# Patient Record
Sex: Female | Born: 1935
Health system: Southern US, Community
[De-identification: ages and names within clinical notes are randomized; demographics above are authoritative.]

## PROBLEM LIST (undated history)

## (undated) DIAGNOSIS — N189 Chronic kidney disease, unspecified: Secondary | ICD-10-CM

## (undated) DIAGNOSIS — E785 Hyperlipidemia, unspecified: Secondary | ICD-10-CM

## (undated) DIAGNOSIS — C801 Malignant (primary) neoplasm, unspecified: Secondary | ICD-10-CM

## (undated) DIAGNOSIS — I10 Essential (primary) hypertension: Secondary | ICD-10-CM

## (undated) DIAGNOSIS — M1990 Unspecified osteoarthritis, unspecified site: Secondary | ICD-10-CM

## (undated) HISTORY — DX: Hyperlipidemia, unspecified: E78.5

## (undated) HISTORY — DX: Malignant (primary) neoplasm, unspecified: C80.1

## (undated) HISTORY — DX: Essential (primary) hypertension: I10

## (undated) HISTORY — DX: Chronic kidney disease, unspecified: N18.9

## (undated) HISTORY — DX: Unspecified osteoarthritis, unspecified site: M19.90

## (undated) HISTORY — PX: HAND SURGERY: SHX662

---

## 2000-02-28 ENCOUNTER — Encounter: Admission: RE | Admit: 2000-02-28 | Discharge: 2000-02-28 | Payer: Self-pay | Admitting: Family Medicine

## 2000-02-28 ENCOUNTER — Encounter: Payer: Self-pay | Admitting: Family Medicine

## 2000-05-29 HISTORY — PX: COLON SURGERY: SHX602

## 2000-12-26 ENCOUNTER — Encounter (INDEPENDENT_AMBULATORY_CARE_PROVIDER_SITE_OTHER): Payer: Self-pay

## 2000-12-26 ENCOUNTER — Ambulatory Visit (HOSPITAL_COMMUNITY): Admission: RE | Admit: 2000-12-26 | Discharge: 2000-12-26 | Payer: Self-pay | Admitting: Gastroenterology

## 2001-01-07 ENCOUNTER — Encounter (INDEPENDENT_AMBULATORY_CARE_PROVIDER_SITE_OTHER): Payer: Self-pay | Admitting: *Deleted

## 2001-01-07 ENCOUNTER — Ambulatory Visit (HOSPITAL_COMMUNITY): Admission: RE | Admit: 2001-01-07 | Discharge: 2001-01-07 | Payer: Self-pay | Admitting: Gastroenterology

## 2001-01-07 ENCOUNTER — Encounter: Payer: Self-pay | Admitting: Gastroenterology

## 2001-01-31 ENCOUNTER — Encounter: Admission: RE | Admit: 2001-01-31 | Discharge: 2001-02-13 | Payer: Self-pay | Admitting: General Surgery

## 2001-01-31 ENCOUNTER — Encounter: Payer: Self-pay | Admitting: General Surgery

## 2001-02-07 ENCOUNTER — Inpatient Hospital Stay (HOSPITAL_COMMUNITY): Admission: RE | Admit: 2001-02-07 | Discharge: 2001-02-13 | Payer: Self-pay | Admitting: General Surgery

## 2001-02-07 ENCOUNTER — Encounter (INDEPENDENT_AMBULATORY_CARE_PROVIDER_SITE_OTHER): Payer: Self-pay | Admitting: Specialist

## 2001-10-25 ENCOUNTER — Encounter: Payer: Self-pay | Admitting: Family Medicine

## 2001-10-25 ENCOUNTER — Encounter: Admission: RE | Admit: 2001-10-25 | Discharge: 2001-10-25 | Payer: Self-pay | Admitting: Family Medicine

## 2002-10-03 ENCOUNTER — Encounter: Payer: Self-pay | Admitting: Surgery

## 2002-10-10 ENCOUNTER — Inpatient Hospital Stay (HOSPITAL_COMMUNITY): Admission: RE | Admit: 2002-10-10 | Discharge: 2002-10-13 | Payer: Self-pay | Admitting: Surgery

## 2002-10-26 ENCOUNTER — Emergency Department (HOSPITAL_COMMUNITY): Admission: EM | Admit: 2002-10-26 | Discharge: 2002-10-26 | Payer: Self-pay | Admitting: Emergency Medicine

## 2002-10-26 ENCOUNTER — Encounter: Payer: Self-pay | Admitting: Emergency Medicine

## 2003-01-29 ENCOUNTER — Encounter: Admission: RE | Admit: 2003-01-29 | Discharge: 2003-01-29 | Payer: Self-pay | Admitting: Family Medicine

## 2003-01-29 ENCOUNTER — Encounter: Payer: Self-pay | Admitting: Family Medicine

## 2004-03-14 ENCOUNTER — Encounter: Admission: RE | Admit: 2004-03-14 | Discharge: 2004-03-14 | Payer: Self-pay | Admitting: Family Medicine

## 2004-03-18 ENCOUNTER — Encounter: Admission: RE | Admit: 2004-03-18 | Discharge: 2004-03-18 | Payer: Self-pay | Admitting: Surgery

## 2005-07-06 ENCOUNTER — Encounter: Admission: RE | Admit: 2005-07-06 | Discharge: 2005-07-06 | Payer: Self-pay | Admitting: Family Medicine

## 2006-07-26 ENCOUNTER — Encounter: Admission: RE | Admit: 2006-07-26 | Discharge: 2006-07-26 | Payer: Self-pay | Admitting: Family Medicine

## 2006-10-01 ENCOUNTER — Encounter: Admission: RE | Admit: 2006-10-01 | Discharge: 2006-10-01 | Payer: Self-pay | Admitting: Family Medicine

## 2007-08-14 ENCOUNTER — Other Ambulatory Visit: Admission: RE | Admit: 2007-08-14 | Discharge: 2007-08-14 | Payer: Self-pay | Admitting: Obstetrics and Gynecology

## 2007-08-15 ENCOUNTER — Encounter: Admission: RE | Admit: 2007-08-15 | Discharge: 2007-08-15 | Payer: Self-pay | Admitting: Family Medicine

## 2008-12-03 ENCOUNTER — Encounter: Admission: RE | Admit: 2008-12-03 | Discharge: 2008-12-03 | Payer: Self-pay | Admitting: Family Medicine

## 2010-01-14 ENCOUNTER — Encounter: Admission: RE | Admit: 2010-01-14 | Discharge: 2010-01-14 | Payer: Self-pay | Admitting: Internal Medicine

## 2010-04-05 ENCOUNTER — Other Ambulatory Visit: Admission: RE | Admit: 2010-04-05 | Discharge: 2010-04-05 | Payer: Self-pay | Admitting: Obstetrics and Gynecology

## 2010-06-18 ENCOUNTER — Encounter: Payer: Self-pay | Admitting: Family Medicine

## 2010-10-14 NOTE — Procedures (Signed)
[place]  Patient:    Cheryl Wilcox, Cheryl Wilcox                       MRN: [id number] Proc. Date: 12/26/00 Adm. Date:  [id number] Attending:  Rich Brave CC:         Desma Maxim, M.D.   Procedure Report  PROCEDURE PERFORMED:  Colonoscopy with polypectomy.  ENDOSCOPIST:  Florencia Reasons, M.D.  INDICATIONS FOR PROCEDURE:  The patient is a 77 year old female for colon cancer screening.  FINDINGS:  Sessile polyp removed from the transverse colon.  DESCRIPTION OF PROCEDURE:  The nature, purpose and risks of the procedure had been discussed with the patient, who provided written consent.  Sedation was fentanyl 75 mcg and Versed 7.5 mg IV without arrhythmias or desaturation.  The Olympus adjustable tension pediatric video colonoscope was advanced quite easily to the cecum as identified by clear visualization of the appendiceal orifice after which pull-back was performed.  There was a 4 x 6 mm sessile polyp across a fold in a clam shell configuration in the midtransverse colon.  I injected the region around the polyp with a total of about 1.5 cc of 1:10,000 epinephrine leading to partial elevation of this lesion as well as regional blanching.  It was then snared off in two pieces with good hemostasis and no evidence of excessive cautery.  The fragments were retrieved by suctioning through the scope.  No other polyps were observed during this exam and I did not see any cancer, colitis, vascular malformations or diverticular disease.  Retroflexion in the rectum was normal.  The patient tolerated the procedure well and there were no apparent complications.  IMPRESSION:  Sessile polyp in the midcolon removed as described above.  PLAN:  Await pathology on the polyp with anticipated colonoscopic follow-up in three years if it is adenomatous in character.DD:  12/26/00 TD:  12/26/00 Job: [id number]

## 2010-10-14 NOTE — Op Note (Signed)
[facility]  Patient:    LAKINA, MCINTIRE [facility] Visit Number: [id number] MRN: [id number]          Service Type: SUR Location: 4W 0442 01 Attending Physician:  Arlis Porta Dictated by:   Adolph Pollack, M.D. Proc. Date: 02/07/01 Admit Date:  02/07/2001   CC:         Florencia Reasons, M.D.  Desma Maxim, M.D.   Operative Report  PREOPERATIVE DIAGNOSIS:  Transverse colon cancer.  POSTOPERATIVE DIAGNOSIS:  Transverse colon cancer plus splenic injury.  OPERATION: 1. Transverse colectomy. 2. Splenectomy.  SURGEON:  Adolph Pollack, M.D.  ASSISTANT:  Sheppard Plumber. Earlene Plater, M.D.  ANESTHESIA:  General  INDICATION:  Mrs. Ives is a 77 year old female undergoing screening colonoscopy.  She was found to have a polyp in the transverse colon and biopsy that showed invasive adenocarcinoma.  She underwent a subsequent colonoscopy and the area was biopsied.  No residual tumor was found.  However, because it was an invasive component close to the stalk, I was concerned that residual cancer was left behind.  Dr. Matthias Hughs injected dye into the area for tattooing and also took a picture of the colonoscope at the site.  She now presents for elective colon resection.  DESCRIPTION OF PROCEDURE:  She was placed supine on the operating table and a general anesthetic was administered.  The abdomen was sterilely prepped and draped.  An upper midline incision was made incising the skin sharply and dividing the subcutaneous tissue, fascia and perineum with electrocautery. Once the peritoneal cavity was entered, the abdomen was explored.  The liver appeared normal without nodules.  No gallstones were noted. No small intestinal lesions were noted.  No pelvic lesions were noted.  I visualized the transverse colon and noted the tattooing in the mid transverse colon.  I subsequently began mobilizing the splenic flexure which is close to the spleen. It came  down fairly easily.  I then mobilized part of the omentum free from the distal transverse colon.  I also mobilized the left colon by incising the White line of [place] and rotating it medially. It was doing this time, that I noticed some blood in the left upper quadrant area and upon examining this, there was a tear in the spleen well away from where the mobilization occurred. I went ahead and packed this and proceeded along with the operation.  I did mobilize the hepatic flexure, then divided the colon at the proximal descending colon and at the hepatic flexure.  I then divided the mesenteric vessels between clamps and ligated them.  I partially divided the omentum doing a partial omentectomy.  I then removed the specimen and sent it to pathology.  Upon opening up the specimen, the pathologist reported to me that they had the old polypectomy site.  Next, I reinspected the left upper quadrant area and noticed there was still some bleeding from a 1 to 1.5 cm tear in the splenic capsule posteriorly. It was not near the hilum.  There was no active vessel bleeding.  I went ahead and placed some Surgicel here and held direct pressure.  This slowed down the bleeding, so I reapplied Surgicel and packed off the spleen. I then directed my attention to the colon.  I reconstituted intestinal continuity with a two layer end-to-end anastomosis, the anterior and posterior layers, the outside layers with 3-0 silk and the internal layer with a running 3-0 Vicryl.  The anastomosis was patent, viable and under no  tension.  I then closed the mesenteric defect with interrupted sutures.  Once again, I reinspected the left upper quadrant area and noticed there was still some bleeding.  I tried electrocautery at very high settings and continued packing.  I spent 45 minutes trying to stop the bleeding; however, the blood loss began to mount.  I subsequently decided to perform a splenectomy.  I rotated the spleen  medially and incised its splenophrenic, splenocolic, and splenorenal attachments.  I divided short gastric vessels after ligating them.  I ligated the splenic artery and vein and divided it.  I removed the spleen from the field and sent it to pathology.  There was one short gastric vessel still oozing and I went ahead and obtained hemostasis with interrupted sutures.  Once the left upper quadrant area was hemostatic, I went ahead and irrigated the abdominal cavity.  No further bleeding was noted.  Subsequently, I closed the fascia with a running #1 PDS suture.  Subcutaneous tissue was irrigated and skin closed with staples.  She lost approximately 1500 cc of blood. She did not receive a blood transfusion and remained hemodynamically stable through the entire operation. She did have the complication of a splenic capsular tear requiring splenectomy; otherwise she did well throughout the operation, was extubated and was taken to the recovery room in satisfactory condition. Dictated by:   Adolph Pollack, M.D. Attending Physician:  Arlis Porta DD:  02/07/01 TD:  02/08/01 Job: [id number]

## 2010-10-14 NOTE — Op Note (Signed)
[place]  Patient:    Cheryl Wilcox, Cheryl Wilcox                       MRN: [id number] Proc. Date: 01/07/01 Adm. Date:  [id number] Attending:  Rich Brave CC:         Desma Maxim, M.D.   Operative Report  PROCEDURE:  Colonoscopy with biopsies.  INDICATIONS:  This is a 77 year old female 12 days status post screening colonoscopy at which time a benign-appearing 6 mm polyp was snared off from the midcolon.  But to our surprise, the pathology came back showing focci of invasive adenocarcinoma within the lesion.  The purpose of this procedure is to mark the site in the event that surgical management is elected at a later time.  FINDINGS:  Polypectomy site identified near the splenic flexure, biopsied, x-rayd, and tattooed.  Two dimminutive sessile polyps biopsied.  DESCRIPTION OF PROCEDURE:  The nature, purpose, and risks of the procedure were familiar to the patient who provided written consent.  Sedation was fentanyl 50 mcg and Versed 5 mg IV without arrhythmias or desaturation.  The Olympus pediatric adjustable tension video colonoscope was advanced without difficulty to the cecum and pullback was then performed.  There was a 3 mm semipedunculated polyp just above the cecum removed by a couple of cold biopsies and there was a dimminuted 2 to 3 mm sessile flat hyperplastic appearing polyp at 10 cm removed by a single cold biopsy.  The main finding on this examination, however, was successful localization of the polypectomy site.  A KUB was obtained to document the location of this, and viewing the film it looked like it was just proximal to the splenic flexure.  Multiple biopsies were made from the site to help confirm the presence or absence of any residual adenomatous or carcinomatous tissue. Finally, the site was injected with [place] Ink to help localize it for further surveillance colonoscopies or in the event that surgery is  elected.  Retroflexion was not performed in the rectum.  No diverticular disease, large polyps, cancer, colitis, or vascular malformations were seen.  The patient tolerated the procedure well and there were no apparent complications.  IMPRESSION: 1. Polypectomy site biopsied, injected, and x-rayd as described above. 2. Small sessile polyps removed.  PLAN:  Await pathology.  Office follow-up in three days. DD:  01/07/01 TD:  01/07/01 Job: [id number]

## 2010-10-14 NOTE — Op Note (Signed)
Cheryl Wilcox, MACLAREN                          ACCOUNT NO.:  [id number]   MEDICAL RECORD NO.:  [id number]                   PATIENT TYPE:  INP   LOCATION:  0002                                 FACILITY:  [facility]   PHYSICIAN:  Velora Heckler, M.D.                DATE OF BIRTH:  1927/10/10   DATE OF PROCEDURE:  10/10/2002  DATE OF DISCHARGE:                                 OPERATIVE REPORT   PREOPERATIVE DIAGNOSIS:  Ventral incisional hernia, incarcerated.   POSTOPERATIVE DIAGNOSIS:  Ventral incisional hernia, incarcerated.   PROCEDURE:  Laparoscopic repair of ventral incisional hernia with polyester  mesh.   SURGEON:  Velora Heckler, M.D.   ASSISTANT:  Sheppard Plumber. Earlene Plater, M.D.   ANESTHESIA:  General.   ESTIMATED BLOOD LOSS:  Minimal.   PREPARATION:  Betadine.   COMPLICATIONS:  None.   INDICATIONS:  The patient is a 77 year old, white female, known to our  practice following colon resection by Adolph Pollack, M.D. in September  2002.  The patient subsequently developed a ventral incisional hernia.  She  returns to our practice for repair.   DESCRIPTION OF PROCEDURE:  The procedure is done in OR #1 at the [facility].  The patient is brought to the operating room, placed in  a supine position on the operating room table.  Following administration of  general anesthesia, the patient is prepped and draped in the usual strict  aseptic fashion.  After ascertaining that an adequate level of anesthesia  had been obtained, a right upper quadrant incision is made with a #15 blade.  Dissection is carried down to the fascia.  External oblique fascia is  incised, and a 0 Vicryl pursestring suture is placed.  Muscle-splitting  incision is then carried down to the peritoneum which is incised sharply,  and the peritoneal cavity is entered.  An Hasson cannula is introduced and  secured with the pursestring suture.  The abdomen is insufflated with carbon  dioxide.   Laparoscope is then introduced.  There are adhesions to the  midline.  There are multiple anterior fascial defects, extending from just  below the xiphoid process to the umbilicus.  There are at least four  separate defects.  In the largest uppermost defect, the transverse colon is  incarcerated.  A moderate amount of omentum is also incarcerated within the  hernia defects.  Operative ports are placed in the right lower quadrant and  two ports on the left side of the abdomen.  Using blunt dissection, sharp  dissection, and electrocautery for hemostasis, the adhesions are taken down  off the anterior abdominal wall.  Colon is gently reduced from the hernia  defect, and adhesions are lysed using the Endoshears.  With careful  dissection, all of the adhesions to the anterior abdominal wall are freed up  to the level of the falciform ligament.  The colon is  reduced without  injury.  Next, a spinal needle is used to mark the margins of all hernia  defects on the abdominal wall.  A 4 cm overlap is then measured.  A 20 cm x  30 cm sheet of polyester mesh is then selected.  It is prepared by placing  interrupted 0 Novofil sutures at eight separate locations around the edges  of the polyester mesh.  The mesh is then prepared by hydrating the mesh and  rolling it and inserting through the right upper quadrant port into the  peritoneal cavity under direct vision.  Mesh is unrolled and positioned  properly with the mesh side facing the abdominal wall and the hydrocellulose-  treated smooth side facing the viscera.  Incisions are then made  circumferentially on the abdominal wall and using the Storz suture  retriever, the sutures are retrieved and brought through the abdominal wall.  After retrieving all eight sutures through the abdominal wall, the sutures  are pulled tightly up against the abdominal wall, positioning the mesh  behind the anterior abdominal wall in close approximation.  All sutures  are  then tied and divided.  Skin is elevated so as not to retract the skin with  the subcutaneous tissues.  Good approximation of the mesh to the anterior  abdominal wall is noted.  Good coverage of all hernia defects is noted.  Using an Ethicon endoscopic tacking device, the mesh is then secured further  to the anterior abdominal wall with a circumferential single row of tacks  spaced approximately 1.5-2 cm apart.  This provides good close approximation  of the mesh to the anterior abdominal wall.  Good hemostasis is noted.  Ports are removed under direct vision with good hemostasis noted at port  sites.  Pneumoperitoneum is released.  Surgical wounds are closed with  interrupted 4-0 Vicryl subcuticular sutures.  Wounds are washed and dried.  Local anesthetic is injected.  Benzoin and Steri-Strips are applied.  Sterile gauze dressings are applied.  An 11 inch abdominal binder is  applied.  The patient is awakened from anesthesia and brought to the  recovery room in stable condition.  The patient tolerated the procedure  well.                                               Velora Heckler, M.D.    TMG/MEDQ  D:  10/10/2002  T:  10/10/2002  Job:  [id number]   cc:   Donia Guiles, M.D.  [address] [place]  [place] [address]  Fax: [phone]   Bernette Redbird, M.D.  [address] [place]  [place], [place] [address]  Fax: [phone]

## 2010-10-14 NOTE — Discharge Summary (Signed)
   Cheryl Wilcox, Cheryl Wilcox                          ACCOUNT NO.:  [id number]   MEDICAL RECORD NO.:  [id number]                   PATIENT TYPE:  INP   LOCATION:  0482                                 [facility]ACILITY[facility]  [facility]   PHYSICIAN:  Velora Heckler, M.D.                DATE OF BIRTH:  1927/11/02   DATE OF ADMISSION:  10/10/2002  DATE OF DISCHARGE:  10/13/2002                                 DISCHARGE SUMMARY   REASON FOR ADMISSION:  Ventral incisional hernia.   BRIEF HISTORY:  The patient is a 77 year old, white female, well-known to  our surgical practice.  The patient had undergone colon resection in  September 2002.  She subsequently developed ventral incisional hernia.  She  now returns for repair.   HOSPITAL COURSE:  The patient was admitted on Oct 10, 2002.  She was taken  to the operating room where she underwent laparoscopic repair of ventral  incisional hernia with polyester mesh.  Postoperative course was relatively  straightforward.  The patient was started on clear liquid diet and advanced  to a regular diet.  She became ambulatory.  She was prepared for discharge  home on the third postoperative day.   DISCHARGE PLANNING:  The patient is discharged home Oct 13, 2002, in good  condition, tolerating a regular diet, and ambulating independently.  She  will be seen back in my office at [facility] in 10 days for a  wound check.   DISCHARGE MEDICATIONS:  Vicodin p.r.n. pain.   FINAL DIAGNOSIS:  Ventral incisional hernia, incarcerated.  Condition.   CONDITION AT DISCHARGE:  Good.                                               Velora Heckler, M.D.    TMG/MEDQ  D:  10/13/2002  T:  10/13/2002  Job:  [id number]   cc:   Donia Guiles, M.D.  [address] [place]  [place] [address]  Fax: [phone]   Bernette Redbird, M.D.  [address] [place]  [place], [place] [address]  Fax: [phone]

## 2010-10-14 NOTE — Discharge Summary (Signed)
[facility]  Patient:    Cheryl Wilcox, Cheryl Wilcox [facility] Visit Number: [id number] MRN: [id number]          Service Type: SUR Location: 4W 0442 01 Attending Physician:  Arlis Porta Admit Date:  02/07/2001 Discharge Date: 02/13/2001   CC:         Florencia Reasons, M.D.  Desma Maxim, M.D.   Discharge Summary  PRINCIPAL DISCHARGE DIAGNOSIS:  Transverse colon cancer.  SECONDARY DIAGNOSES: 1. Intraoperative splenic laceration. 2. Hyperlipidemia. 3. Hypertension.  PROCEDURE:  Transverse colectomy and splenectomy 02/07/01.  REASON FOR ADMISSION:  Ms. Bomkamp is a 77 year old female who underwent screening colonoscopy and was found to have a polyp in the distal transverse colon which was biopsied and positive for invasive carcinoma.  She now presents for partial colectomy.  HOSPITAL COURSE:  She underwent the above procedure and during the operation, she sustained a small splenic laceration.  This could not be controlled with normal measures, and thus she required a splenectomy.  Postoperatively she received ______ for pneumococcus, meningococcus, and Haemophilus influenza B. She was enrolled in the Adalor study for postoperative ileus and received m ed by way of the research nurse during her entire stay.  She essentially had some nausea and hypokalemia but otherwise an unremarkable postoperative course.  On her fourth postoperative day, she had return of her bowel function with bowel movements and flatus.  Her nausea improved.  She was put on oral analgesics. Pathology came back as a T2,N0,M1 cancer, thus a stage I colon cancer. Potassium was corrected, she was able to be discharged in satisfactory condition on postoperative day #6.  DISPOSITION:  Discharged to home 02/13/01.  She was told she could resume her home medications and was given Mepergan for pain.  She was given activity restrictions and a discharge instruction sheet.  She will come  back and see me in the office in two weeks. Attending Physician:  Arlis Porta DD:  02/25/01 TD:  02/25/01 Job: [id number]

## 2011-02-01 ENCOUNTER — Other Ambulatory Visit: Payer: Self-pay | Admitting: Internal Medicine

## 2011-02-01 DIAGNOSIS — Z1231 Encounter for screening mammogram for malignant neoplasm of breast: Secondary | ICD-10-CM

## 2011-03-09 ENCOUNTER — Ambulatory Visit: Payer: Self-pay

## 2011-03-23 ENCOUNTER — Ambulatory Visit
Admission: RE | Admit: 2011-03-23 | Discharge: 2011-03-23 | Disposition: A | Discharge status: Home / Self Care | Payer: Medicare Other | Source: Ambulatory Visit | Attending: Internal Medicine | Admitting: Internal Medicine

## 2011-03-23 DIAGNOSIS — Z1231 Encounter for screening mammogram for malignant neoplasm of breast: Secondary | ICD-10-CM

## 2011-03-29 ENCOUNTER — Other Ambulatory Visit: Payer: Self-pay | Admitting: Internal Medicine

## 2011-03-29 ENCOUNTER — Other Ambulatory Visit: Payer: Self-pay | Admitting: Gastroenterology

## 2011-03-29 DIAGNOSIS — R928 Other abnormal and inconclusive findings on diagnostic imaging of breast: Secondary | ICD-10-CM

## 2011-04-12 ENCOUNTER — Ambulatory Visit
Admission: RE | Admit: 2011-04-12 | Discharge: 2011-04-12 | Disposition: A | Discharge status: Home / Self Care | Payer: Medicare Other | Source: Ambulatory Visit | Attending: Internal Medicine | Admitting: Internal Medicine

## 2011-04-12 DIAGNOSIS — R928 Other abnormal and inconclusive findings on diagnostic imaging of breast: Secondary | ICD-10-CM

## 2011-09-06 DIAGNOSIS — R634 Abnormal weight loss: Secondary | ICD-10-CM | POA: Diagnosis not present

## 2011-09-06 DIAGNOSIS — I10 Essential (primary) hypertension: Secondary | ICD-10-CM | POA: Diagnosis not present

## 2011-09-06 DIAGNOSIS — E782 Mixed hyperlipidemia: Secondary | ICD-10-CM | POA: Diagnosis not present

## 2011-09-19 ENCOUNTER — Other Ambulatory Visit: Payer: Self-pay | Admitting: Family Medicine

## 2011-09-19 DIAGNOSIS — R921 Mammographic calcification found on diagnostic imaging of breast: Secondary | ICD-10-CM

## 2011-10-12 ENCOUNTER — Ambulatory Visit
Admission: RE | Admit: 2011-10-12 | Discharge: 2011-10-12 | Disposition: A | Discharge status: Home / Self Care | Payer: Medicare Other | Source: Ambulatory Visit | Attending: Family Medicine | Admitting: Family Medicine

## 2011-10-12 DIAGNOSIS — R928 Other abnormal and inconclusive findings on diagnostic imaging of breast: Secondary | ICD-10-CM | POA: Diagnosis not present

## 2011-10-12 DIAGNOSIS — R921 Mammographic calcification found on diagnostic imaging of breast: Secondary | ICD-10-CM

## 2011-11-20 DIAGNOSIS — R42 Dizziness and giddiness: Secondary | ICD-10-CM | POA: Diagnosis not present

## 2011-11-20 DIAGNOSIS — D492 Neoplasm of unspecified behavior of bone, soft tissue, and skin: Secondary | ICD-10-CM | POA: Diagnosis not present

## 2011-11-22 ENCOUNTER — Other Ambulatory Visit: Payer: Self-pay | Admitting: Family Medicine

## 2011-11-22 DIAGNOSIS — C44621 Squamous cell carcinoma of skin of unspecified upper limb, including shoulder: Secondary | ICD-10-CM | POA: Diagnosis not present

## 2011-12-11 DIAGNOSIS — C4492 Squamous cell carcinoma of skin, unspecified: Secondary | ICD-10-CM | POA: Diagnosis not present

## 2012-02-28 DIAGNOSIS — Z23 Encounter for immunization: Secondary | ICD-10-CM | POA: Diagnosis not present

## 2012-03-04 DIAGNOSIS — Z23 Encounter for immunization: Secondary | ICD-10-CM | POA: Diagnosis not present

## 2012-03-04 DIAGNOSIS — M899 Disorder of bone, unspecified: Secondary | ICD-10-CM | POA: Diagnosis not present

## 2012-03-04 DIAGNOSIS — Z1331 Encounter for screening for depression: Secondary | ICD-10-CM | POA: Diagnosis not present

## 2012-03-04 DIAGNOSIS — I10 Essential (primary) hypertension: Secondary | ICD-10-CM | POA: Diagnosis not present

## 2012-03-04 DIAGNOSIS — Z0000 Encounter for general adult medical examination without abnormal findings: Secondary | ICD-10-CM | POA: Diagnosis not present

## 2012-03-04 DIAGNOSIS — K7689 Other specified diseases of liver: Secondary | ICD-10-CM | POA: Diagnosis not present

## 2012-03-04 DIAGNOSIS — Z85038 Personal history of other malignant neoplasm of large intestine: Secondary | ICD-10-CM | POA: Diagnosis not present

## 2012-03-04 DIAGNOSIS — E782 Mixed hyperlipidemia: Secondary | ICD-10-CM | POA: Diagnosis not present

## 2012-03-04 DIAGNOSIS — M949 Disorder of cartilage, unspecified: Secondary | ICD-10-CM | POA: Diagnosis not present

## 2012-03-15 DIAGNOSIS — E876 Hypokalemia: Secondary | ICD-10-CM | POA: Diagnosis not present

## 2012-05-01 DIAGNOSIS — M899 Disorder of bone, unspecified: Secondary | ICD-10-CM | POA: Diagnosis not present

## 2012-05-27 ENCOUNTER — Other Ambulatory Visit: Payer: Self-pay | Admitting: Family Medicine

## 2012-05-27 DIAGNOSIS — Z1231 Encounter for screening mammogram for malignant neoplasm of breast: Secondary | ICD-10-CM

## 2012-05-27 DIAGNOSIS — R921 Mammographic calcification found on diagnostic imaging of breast: Secondary | ICD-10-CM

## 2012-09-05 ENCOUNTER — Ambulatory Visit
Admission: RE | Admit: 2012-09-05 | Discharge: 2012-09-05 | Disposition: A | Discharge status: Home / Self Care | Payer: Medicare Other | Source: Ambulatory Visit | Attending: Family Medicine | Admitting: Family Medicine

## 2012-09-05 DIAGNOSIS — R921 Mammographic calcification found on diagnostic imaging of breast: Secondary | ICD-10-CM

## 2012-09-05 DIAGNOSIS — R928 Other abnormal and inconclusive findings on diagnostic imaging of breast: Secondary | ICD-10-CM | POA: Diagnosis not present

## 2012-10-23 DIAGNOSIS — R748 Abnormal levels of other serum enzymes: Secondary | ICD-10-CM | POA: Diagnosis not present

## 2012-10-23 DIAGNOSIS — I129 Hypertensive chronic kidney disease with stage 1 through stage 4 chronic kidney disease, or unspecified chronic kidney disease: Secondary | ICD-10-CM | POA: Diagnosis not present

## 2012-10-23 DIAGNOSIS — E782 Mixed hyperlipidemia: Secondary | ICD-10-CM | POA: Diagnosis not present

## 2012-10-23 DIAGNOSIS — N183 Chronic kidney disease, stage 3 unspecified: Secondary | ICD-10-CM | POA: Diagnosis not present

## 2013-03-07 DIAGNOSIS — Z23 Encounter for immunization: Secondary | ICD-10-CM | POA: Diagnosis not present

## 2013-11-03 DIAGNOSIS — I129 Hypertensive chronic kidney disease with stage 1 through stage 4 chronic kidney disease, or unspecified chronic kidney disease: Secondary | ICD-10-CM | POA: Diagnosis not present

## 2013-11-03 DIAGNOSIS — R748 Abnormal levels of other serum enzymes: Secondary | ICD-10-CM | POA: Diagnosis not present

## 2013-11-03 DIAGNOSIS — E782 Mixed hyperlipidemia: Secondary | ICD-10-CM | POA: Diagnosis not present

## 2013-11-03 DIAGNOSIS — N183 Chronic kidney disease, stage 3 unspecified: Secondary | ICD-10-CM | POA: Diagnosis not present

## 2013-11-03 DIAGNOSIS — M949 Disorder of cartilage, unspecified: Secondary | ICD-10-CM | POA: Diagnosis not present

## 2013-11-03 DIAGNOSIS — Z23 Encounter for immunization: Secondary | ICD-10-CM | POA: Diagnosis not present

## 2013-11-03 DIAGNOSIS — M899 Disorder of bone, unspecified: Secondary | ICD-10-CM | POA: Diagnosis not present

## 2013-11-24 ENCOUNTER — Other Ambulatory Visit: Payer: Self-pay | Admitting: Family Medicine

## 2013-11-24 DIAGNOSIS — R921 Mammographic calcification found on diagnostic imaging of breast: Secondary | ICD-10-CM

## 2013-12-04 ENCOUNTER — Ambulatory Visit
Admission: RE | Admit: 2013-12-04 | Discharge: 2013-12-04 | Disposition: A | Discharge status: Home / Self Care | Payer: Medicare Other | Source: Ambulatory Visit | Attending: Family Medicine | Admitting: Family Medicine

## 2013-12-04 DIAGNOSIS — R928 Other abnormal and inconclusive findings on diagnostic imaging of breast: Secondary | ICD-10-CM | POA: Diagnosis not present

## 2013-12-04 DIAGNOSIS — R921 Mammographic calcification found on diagnostic imaging of breast: Secondary | ICD-10-CM

## 2014-03-18 DIAGNOSIS — Z23 Encounter for immunization: Secondary | ICD-10-CM | POA: Diagnosis not present

## 2014-09-17 DIAGNOSIS — I129 Hypertensive chronic kidney disease with stage 1 through stage 4 chronic kidney disease, or unspecified chronic kidney disease: Secondary | ICD-10-CM | POA: Diagnosis not present

## 2014-09-17 DIAGNOSIS — N183 Chronic kidney disease, stage 3 (moderate): Secondary | ICD-10-CM | POA: Diagnosis not present

## 2014-09-17 DIAGNOSIS — R200 Anesthesia of skin: Secondary | ICD-10-CM | POA: Diagnosis not present

## 2014-12-25 DIAGNOSIS — M545 Low back pain: Secondary | ICD-10-CM | POA: Diagnosis not present

## 2014-12-25 DIAGNOSIS — H1030 Unspecified acute conjunctivitis, unspecified eye: Secondary | ICD-10-CM | POA: Diagnosis not present

## 2015-02-07 DIAGNOSIS — Z23 Encounter for immunization: Secondary | ICD-10-CM | POA: Diagnosis not present

## 2015-03-04 DIAGNOSIS — G5602 Carpal tunnel syndrome, left upper limb: Secondary | ICD-10-CM | POA: Diagnosis not present

## 2015-03-04 DIAGNOSIS — G5601 Carpal tunnel syndrome, right upper limb: Secondary | ICD-10-CM | POA: Diagnosis not present

## 2015-03-08 DIAGNOSIS — G5601 Carpal tunnel syndrome, right upper limb: Secondary | ICD-10-CM | POA: Diagnosis not present

## 2015-03-08 DIAGNOSIS — G5602 Carpal tunnel syndrome, left upper limb: Secondary | ICD-10-CM | POA: Diagnosis not present

## 2015-03-16 DIAGNOSIS — G5601 Carpal tunnel syndrome, right upper limb: Secondary | ICD-10-CM | POA: Diagnosis not present

## 2015-03-16 DIAGNOSIS — G5602 Carpal tunnel syndrome, left upper limb: Secondary | ICD-10-CM | POA: Diagnosis not present

## 2015-04-13 DIAGNOSIS — G5601 Carpal tunnel syndrome, right upper limb: Secondary | ICD-10-CM | POA: Diagnosis not present

## 2015-04-13 DIAGNOSIS — G5602 Carpal tunnel syndrome, left upper limb: Secondary | ICD-10-CM | POA: Diagnosis not present

## 2015-04-28 DIAGNOSIS — G5601 Carpal tunnel syndrome, right upper limb: Secondary | ICD-10-CM | POA: Diagnosis not present

## 2015-05-10 DIAGNOSIS — Z4789 Encounter for other orthopedic aftercare: Secondary | ICD-10-CM | POA: Diagnosis not present

## 2015-06-15 DIAGNOSIS — Z4789 Encounter for other orthopedic aftercare: Secondary | ICD-10-CM | POA: Diagnosis not present

## 2015-07-27 DIAGNOSIS — G5602 Carpal tunnel syndrome, left upper limb: Secondary | ICD-10-CM | POA: Diagnosis not present

## 2015-07-27 DIAGNOSIS — G5601 Carpal tunnel syndrome, right upper limb: Secondary | ICD-10-CM | POA: Diagnosis not present

## 2015-11-27 HISTORY — PX: OTHER SURGICAL HISTORY: SHX169

## 2016-01-14 ENCOUNTER — Other Ambulatory Visit: Payer: Self-pay | Admitting: Family Medicine

## 2016-01-14 DIAGNOSIS — Z1231 Encounter for screening mammogram for malignant neoplasm of breast: Secondary | ICD-10-CM

## 2016-02-09 DIAGNOSIS — K760 Fatty (change of) liver, not elsewhere classified: Secondary | ICD-10-CM | POA: Diagnosis not present

## 2016-02-09 DIAGNOSIS — E782 Mixed hyperlipidemia: Secondary | ICD-10-CM | POA: Diagnosis not present

## 2016-02-09 DIAGNOSIS — I129 Hypertensive chronic kidney disease with stage 1 through stage 4 chronic kidney disease, or unspecified chronic kidney disease: Secondary | ICD-10-CM | POA: Diagnosis not present

## 2016-02-09 DIAGNOSIS — Z0000 Encounter for general adult medical examination without abnormal findings: Secondary | ICD-10-CM | POA: Diagnosis not present

## 2016-02-09 DIAGNOSIS — Z85038 Personal history of other malignant neoplasm of large intestine: Secondary | ICD-10-CM | POA: Diagnosis not present

## 2016-02-09 DIAGNOSIS — M899 Disorder of bone, unspecified: Secondary | ICD-10-CM | POA: Diagnosis not present

## 2016-02-09 DIAGNOSIS — M1990 Unspecified osteoarthritis, unspecified site: Secondary | ICD-10-CM | POA: Diagnosis not present

## 2016-02-09 DIAGNOSIS — N183 Chronic kidney disease, stage 3 (moderate): Secondary | ICD-10-CM | POA: Diagnosis not present

## 2016-02-18 DIAGNOSIS — Z23 Encounter for immunization: Secondary | ICD-10-CM | POA: Diagnosis not present

## 2016-02-24 ENCOUNTER — Ambulatory Visit
Admission: RE | Admit: 2016-02-24 | Discharge: 2016-02-24 | Disposition: A | Discharge status: Home / Self Care | Payer: Medicare Other | Source: Ambulatory Visit | Attending: Family Medicine | Admitting: Family Medicine

## 2016-02-24 DIAGNOSIS — Z1231 Encounter for screening mammogram for malignant neoplasm of breast: Secondary | ICD-10-CM

## 2016-03-08 DIAGNOSIS — R7301 Impaired fasting glucose: Secondary | ICD-10-CM | POA: Diagnosis not present

## 2016-03-08 DIAGNOSIS — M8588 Other specified disorders of bone density and structure, other site: Secondary | ICD-10-CM | POA: Diagnosis not present

## 2017-02-01 DIAGNOSIS — Z23 Encounter for immunization: Secondary | ICD-10-CM | POA: Diagnosis not present

## 2017-02-28 DIAGNOSIS — Z0000 Encounter for general adult medical examination without abnormal findings: Secondary | ICD-10-CM | POA: Diagnosis not present

## 2017-02-28 DIAGNOSIS — E782 Mixed hyperlipidemia: Secondary | ICD-10-CM | POA: Diagnosis not present

## 2017-02-28 DIAGNOSIS — N183 Chronic kidney disease, stage 3 (moderate): Secondary | ICD-10-CM | POA: Diagnosis not present

## 2017-02-28 DIAGNOSIS — Z85038 Personal history of other malignant neoplasm of large intestine: Secondary | ICD-10-CM | POA: Diagnosis not present

## 2017-02-28 DIAGNOSIS — M1990 Unspecified osteoarthritis, unspecified site: Secondary | ICD-10-CM | POA: Diagnosis not present

## 2017-02-28 DIAGNOSIS — M899 Disorder of bone, unspecified: Secondary | ICD-10-CM | POA: Diagnosis not present

## 2017-02-28 DIAGNOSIS — R7301 Impaired fasting glucose: Secondary | ICD-10-CM | POA: Diagnosis not present

## 2017-02-28 DIAGNOSIS — I129 Hypertensive chronic kidney disease with stage 1 through stage 4 chronic kidney disease, or unspecified chronic kidney disease: Secondary | ICD-10-CM | POA: Diagnosis not present

## 2017-03-08 DIAGNOSIS — E876 Hypokalemia: Secondary | ICD-10-CM | POA: Diagnosis not present

## 2017-03-16 ENCOUNTER — Other Ambulatory Visit: Payer: Self-pay | Admitting: Physician Assistant

## 2017-03-16 ENCOUNTER — Ambulatory Visit
Admission: RE | Admit: 2017-03-16 | Discharge: 2017-03-16 | Disposition: A | Discharge status: Home / Self Care | Payer: Medicare Other | Source: Ambulatory Visit | Attending: Physician Assistant | Admitting: Physician Assistant

## 2017-03-16 DIAGNOSIS — W19XXXA Unspecified fall, initial encounter: Secondary | ICD-10-CM

## 2017-03-16 DIAGNOSIS — R0789 Other chest pain: Secondary | ICD-10-CM | POA: Diagnosis not present

## 2017-03-16 DIAGNOSIS — S2242XA Multiple fractures of ribs, left side, initial encounter for closed fracture: Secondary | ICD-10-CM | POA: Diagnosis not present

## 2017-03-16 DIAGNOSIS — J939 Pneumothorax, unspecified: Secondary | ICD-10-CM | POA: Diagnosis not present

## 2017-03-16 DIAGNOSIS — S2249XA Multiple fractures of ribs, unspecified side, initial encounter for closed fracture: Secondary | ICD-10-CM | POA: Diagnosis not present

## 2017-03-20 ENCOUNTER — Other Ambulatory Visit: Payer: Self-pay | Admitting: Cardiothoracic Surgery

## 2017-03-20 DIAGNOSIS — J939 Pneumothorax, unspecified: Secondary | ICD-10-CM

## 2017-03-21 ENCOUNTER — Encounter: Payer: Self-pay | Admitting: Cardiothoracic Surgery

## 2017-03-21 ENCOUNTER — Institutional Professional Consult (permissible substitution) (INDEPENDENT_AMBULATORY_CARE_PROVIDER_SITE_OTHER): Payer: Medicare Other | Admitting: Cardiothoracic Surgery

## 2017-03-21 ENCOUNTER — Ambulatory Visit
Admission: RE | Admit: 2017-03-21 | Discharge: 2017-03-21 | Disposition: A | Discharge status: Home / Self Care | Payer: Medicare Other | Source: Ambulatory Visit | Attending: Cardiothoracic Surgery | Admitting: Cardiothoracic Surgery

## 2017-03-21 ENCOUNTER — Encounter: Payer: Self-pay | Admitting: *Deleted

## 2017-03-21 VITALS — BP 130/80 | HR 100 | Resp 16 | Ht 61.0 in | Wt 140.0 lb

## 2017-03-21 DIAGNOSIS — I10 Essential (primary) hypertension: Secondary | ICD-10-CM

## 2017-03-21 DIAGNOSIS — N183 Chronic kidney disease, stage 3 unspecified: Secondary | ICD-10-CM

## 2017-03-21 DIAGNOSIS — N189 Chronic kidney disease, unspecified: Secondary | ICD-10-CM | POA: Insufficient documentation

## 2017-03-21 DIAGNOSIS — S2242XA Multiple fractures of ribs, left side, initial encounter for closed fracture: Secondary | ICD-10-CM | POA: Diagnosis not present

## 2017-03-21 DIAGNOSIS — M1990 Unspecified osteoarthritis, unspecified site: Secondary | ICD-10-CM

## 2017-03-21 DIAGNOSIS — J939 Pneumothorax, unspecified: Secondary | ICD-10-CM

## 2017-03-21 DIAGNOSIS — J90 Pleural effusion, not elsewhere classified: Secondary | ICD-10-CM | POA: Diagnosis not present

## 2017-03-21 DIAGNOSIS — E785 Hyperlipidemia, unspecified: Secondary | ICD-10-CM | POA: Insufficient documentation

## 2017-03-21 DIAGNOSIS — C801 Malignant (primary) neoplasm, unspecified: Secondary | ICD-10-CM

## 2017-03-21 NOTE — Progress Notes (Signed)
PCP is Mayra Neer, MD Referring Provider is Lennie Odor, [place]  Chief Complaint  Patient presents with  . New Patient (Initial Visit)    Pneumothorax left side.Marland KitchenMarland KitchenCXR    HPI: 83 year old female presents for evaluation of recent left rib fractures, ribs 5, 6 and 7 with slight displacement.  The patient fell 9 days ago at the bottom of the stairway at home.  She has left-sided pain.  5 days ago she was evaluated by her primary care physician and had a chest x-ray.  This demonstrated the fractures of ribs 5, 6, 7 on the left with a slight left apical pneumothorax.  There is a mild pleural effusion.  Her oxygen saturation was 94% on room air.  It was felt she did not want or need hospitalization and she presents today with a follow-up chest x-ray for a thoracic evaluation.  Patient has been sleeping in recliner.  She is been taking Tylenol for the pain.  She denies fever or wet cough.  She has lost a slight amount of weight with this injury.  She lives alone and is able to do her ADLs with minimal difficulty.  Chest x-ray today shows resolution of the left apical pneumothorax.  There is still some atelectasis in the left base.  Ribs appear unchanged. Past Medical History:  Diagnosis Date  . Arthritis    OSTEO  . Cancer ([place])    COLON 2002  . Chronic kidney disease    CKD STAGE III  . Hyperlipidemia   . Hypertension     Past Surgical History:  Procedure Laterality Date  . CARAL TUNNEL Right 11/2015  . COLON SURGERY  2002   LEFT HEMICOLECTOMY WITH SPLENECTOMY...STAGE I  . HAND SURGERY     FOR  [facility]    No family history on file.  Social History Social History  Substance Use Topics  . Smoking status: Never Smoker  . Smokeless tobacco: Never Used  . Alcohol use No    Current Outpatient Prescriptions  Medication Sig Dispense Refill  . acetaminophen (TYLENOL) 500 MG tablet Take 1,000 mg by mouth every 6 (six) hours as needed.    Marland Kitchen amLODipine (NORVASC) 5 MG tablet Take 5 mg  by mouth daily.    Marland Kitchen aspirin EC 81 MG tablet Take 81 mg by mouth daily.    Marland Kitchen atorvastatin (LIPITOR) 10 MG tablet Take 10 mg by mouth daily.    . Calcium Carbonate (CALCIUM 600 PO) Take 1 tablet by mouth 2 (two) times daily with a meal.    . Cholecalciferol (VITAMIN D3 PO) Take 2,000 Units by mouth 2 (two) times daily.    Marland Kitchen ibandronate (BONIVA) 150 MG tablet Take 150 mg by mouth every 30 (thirty) days. Take in the morning with a full glass of water, on an empty stomach, and do not take anything else by mouth or lie down for the next 30 min.    . triamterene-hydrochlorothiazide (DYAZIDE) 37.5-25 MG capsule Take 1 capsule by mouth daily.     No current facility-administered medications for this visit.     No Known Allergies  Review of Systems  Review of systems negative except for those symptoms described in present illness  BP 130/80 (BP Location: Left Arm, Patient Position: Sitting, Cuff Size: Normal)   Pulse 100   Resp 16   Ht 5\' 1"  (1.549 m)   Wt 140 lb (63.5 kg)   SpO2 94% Comment: ON RA  BMI 26.45 kg/m  Physical Exam  Physical Exam  General: 83 year old fragile female in a wheelchair alert in no acute distress accompanied by her niece HEENT: Normocephalic pupils equal , dentition adequate Neck: Supple without JVD, adenopathy, or bruit  Chest: Clear to auscultation, symmetrical breath sounds, no rhonchi, tenderness to palpation beneath the tip of the left scapula in the region of the fifth posterior rib.  No chest wall deformity.              Cardiovascular: Regular rate and rhythm, no murmur, no gallop, peripheral pulses             palpable in all extremities Abdomen:  Soft, nontender, no palpable mass or organomegaly Extremities: Warm, well-perfused, no clubbing cyanosis edema or tenderness,              no venous stasis changes of the legs Rectal/GU: Deferred Neuro: Grossly non--focal and symmetrical throughout Skin: Clean and dry without rash or  ulceration   Diagnostic Tests: Chest x-ray performed today personally reviewed showing resolution of the apical pneumothorax with some residual basilar atelectasis on the left side  Impression: Minimally displaced fractures of left ribs 5, 6, 7 Small left pneumothorax initially seen on original x-ray has resolved But no evidence of pneumonia or hemothorax  Plan: Patient was given a prescription for Ultram to take at night to help her sleep.  She is instructed to take 10 deep breaths every hour to help prevent atelectasis.  We will have her come in follow-up with a repeat chest x-ray on November 7.  She was advised not to use an Ace wrap around her chest.  She will call us for any new problems.   Len Childs, MD [facility] [phone]

## 2017-04-03 ENCOUNTER — Other Ambulatory Visit: Payer: Self-pay | Admitting: Cardiothoracic Surgery

## 2017-04-03 DIAGNOSIS — C3490 Malignant neoplasm of unspecified part of unspecified bronchus or lung: Secondary | ICD-10-CM

## 2017-04-03 DIAGNOSIS — C801 Malignant (primary) neoplasm, unspecified: Secondary | ICD-10-CM

## 2017-04-04 ENCOUNTER — Other Ambulatory Visit: Payer: Self-pay

## 2017-04-04 ENCOUNTER — Encounter: Payer: Self-pay | Admitting: Cardiothoracic Surgery

## 2017-04-04 ENCOUNTER — Ambulatory Visit (INDEPENDENT_AMBULATORY_CARE_PROVIDER_SITE_OTHER): Payer: Medicare Other | Admitting: Cardiothoracic Surgery

## 2017-04-04 ENCOUNTER — Ambulatory Visit
Admission: RE | Admit: 2017-04-04 | Discharge: 2017-04-04 | Disposition: A | Discharge status: Home / Self Care | Payer: Medicare Other | Source: Ambulatory Visit | Attending: Cardiothoracic Surgery | Admitting: Cardiothoracic Surgery

## 2017-04-04 VITALS — BP 122/73 | HR 100 | Ht 61.0 in | Wt 140.0 lb

## 2017-04-04 DIAGNOSIS — S2242XD Multiple fractures of ribs, left side, subsequent encounter for fracture with routine healing: Secondary | ICD-10-CM

## 2017-04-04 DIAGNOSIS — C3490 Malignant neoplasm of unspecified part of unspecified bronchus or lung: Secondary | ICD-10-CM

## 2017-04-04 DIAGNOSIS — J90 Pleural effusion, not elsewhere classified: Secondary | ICD-10-CM | POA: Diagnosis not present

## 2017-04-04 NOTE — Progress Notes (Signed)
PCP is Mayra Neer, MD Referring Provider is Redmon, Barth Kirks, PA-C  No chief complaint on file.   HPI: Follow-up visit for left-sided rib fractures sustained after a fall at home on a stairway. The patient states her pain is improved and she denies pulmonary symptoms of shortness of breath productive cough hemoptysis or fever. Chest x-ray today shows healing of the rib fractures, no pneumothorax or pleural effusion noted Patient was told she could become more independent but should not lift more than 10 pounds for another 4 weeks.  She was advised not to drive for another 2 weeks.   Past Medical History:  Diagnosis Date  . Arthritis    OSTEO  . Cancer ([place])    COLON 2002  . Chronic kidney disease    CKD STAGE III  . Hyperlipidemia   . Hypertension     Past Surgical History:  Procedure Laterality Date  . CARAL TUNNEL Right 11/2015  . COLON SURGERY  2002   LEFT HEMICOLECTOMY WITH SPLENECTOMY...STAGE I  . HAND SURGERY     FOR  [facility]    History reviewed. No pertinent family history.  Social History Social History   Tobacco Use  . Smoking status: Never Smoker  . Smokeless tobacco: Never Used  Substance Use Topics  . Alcohol use: No  . Drug use: Not on file    Current Outpatient Medications  Medication Sig Dispense Refill  . acetaminophen (TYLENOL) 500 MG tablet Take 1,000 mg by mouth every 6 (six) hours as needed.    Marland Kitchen amLODipine (NORVASC) 5 MG tablet Take 5 mg by mouth daily.    Marland Kitchen aspirin EC 81 MG tablet Take 81 mg by mouth daily.    Marland Kitchen atorvastatin (LIPITOR) 10 MG tablet Take 10 mg by mouth daily.    . Calcium Carbonate (CALCIUM 600 PO) Take 1 tablet by mouth 2 (two) times daily with a meal.    . Cholecalciferol (VITAMIN D3 PO) Take 2,000 Units by mouth 2 (two) times daily.    Marland Kitchen ibandronate (BONIVA) 150 MG tablet Take 150 mg by mouth every 30 (thirty) days. Take in the morning with a full glass of water, on an empty stomach, and do not take anything else by mouth or  lie down for the next 30 min.    . triamterene-hydrochlorothiazide (DYAZIDE) 37.5-25 MG capsule Take 1 capsule by mouth daily.     No current facility-administered medications for this visit.     No Known Allergies  Review of Systems  With improved appetite and overall strength No fever No ankle edema  BP 122/73   Pulse 100   Ht 5\' 1"  (1.549 m)   Wt 140 lb (63.5 kg)   SpO2 95%   BMI 26.45 kg/m  Physical Exam Patient is able to get up out of chair and walk easily across the room and back. Lungs are clear. Heart rate is regular. Good range of motion of each upper extremity. Minimal point tenderness on the left thorax.  No instability noted.  Diagnostic Tests: Chest x-ray shows improvement of the left rib fractures  Impression: Patient showing excellent recovery.  No complications noted from rib fractures.  She needs to be careful about lifting and driving and we discussed specific limitations and timing.  Plan: Return as needed.   Len Childs, MD [facility] [phone]

## 2017-07-26 DIAGNOSIS — R300 Dysuria: Secondary | ICD-10-CM | POA: Diagnosis not present

## 2017-07-26 DIAGNOSIS — R109 Unspecified abdominal pain: Secondary | ICD-10-CM | POA: Diagnosis not present

## 2017-07-30 ENCOUNTER — Other Ambulatory Visit: Payer: Self-pay | Admitting: Physician Assistant

## 2017-07-30 ENCOUNTER — Ambulatory Visit
Admission: RE | Admit: 2017-07-30 | Discharge: 2017-07-30 | Disposition: A | Discharge status: Home / Self Care | Payer: Medicare Other | Source: Ambulatory Visit | Attending: Physician Assistant | Admitting: Physician Assistant

## 2017-07-30 DIAGNOSIS — T1490XA Injury, unspecified, initial encounter: Secondary | ICD-10-CM

## 2017-07-30 DIAGNOSIS — S4991XA Unspecified injury of right shoulder and upper arm, initial encounter: Secondary | ICD-10-CM | POA: Diagnosis not present

## 2017-07-30 DIAGNOSIS — M25512 Pain in left shoulder: Secondary | ICD-10-CM | POA: Diagnosis not present

## 2017-07-30 DIAGNOSIS — S40012A Contusion of left shoulder, initial encounter: Secondary | ICD-10-CM | POA: Diagnosis not present

## 2017-07-30 DIAGNOSIS — M25511 Pain in right shoulder: Secondary | ICD-10-CM | POA: Diagnosis not present

## 2017-07-30 DIAGNOSIS — S0093XA Contusion of unspecified part of head, initial encounter: Secondary | ICD-10-CM | POA: Diagnosis not present

## 2017-07-30 DIAGNOSIS — R0789 Other chest pain: Secondary | ICD-10-CM | POA: Diagnosis not present

## 2017-09-06 DIAGNOSIS — I129 Hypertensive chronic kidney disease with stage 1 through stage 4 chronic kidney disease, or unspecified chronic kidney disease: Secondary | ICD-10-CM | POA: Diagnosis not present

## 2017-09-06 DIAGNOSIS — N183 Chronic kidney disease, stage 3 (moderate): Secondary | ICD-10-CM | POA: Diagnosis not present

## 2017-09-06 DIAGNOSIS — E782 Mixed hyperlipidemia: Secondary | ICD-10-CM | POA: Diagnosis not present

## 2017-09-06 DIAGNOSIS — R7301 Impaired fasting glucose: Secondary | ICD-10-CM | POA: Diagnosis not present

## 2017-10-17 DIAGNOSIS — L98499 Non-pressure chronic ulcer of skin of other sites with unspecified severity: Secondary | ICD-10-CM | POA: Diagnosis not present

## 2017-10-25 ENCOUNTER — Other Ambulatory Visit: Payer: Self-pay | Admitting: Podiatry

## 2017-10-25 ENCOUNTER — Ambulatory Visit (INDEPENDENT_AMBULATORY_CARE_PROVIDER_SITE_OTHER): Payer: Medicare Other

## 2017-10-25 ENCOUNTER — Encounter

## 2017-10-25 ENCOUNTER — Encounter: Payer: Self-pay | Admitting: Podiatry

## 2017-10-25 ENCOUNTER — Ambulatory Visit (INDEPENDENT_AMBULATORY_CARE_PROVIDER_SITE_OTHER): Payer: Medicare Other | Admitting: Podiatry

## 2017-10-25 ENCOUNTER — Other Ambulatory Visit: Payer: Self-pay

## 2017-10-25 VITALS — BP 137/74 | HR 108 | Resp 16

## 2017-10-25 DIAGNOSIS — M79675 Pain in left toe(s): Secondary | ICD-10-CM

## 2017-10-25 DIAGNOSIS — M21622 Bunionette of left foot: Secondary | ICD-10-CM | POA: Diagnosis not present

## 2017-10-25 DIAGNOSIS — B351 Tinea unguium: Secondary | ICD-10-CM | POA: Diagnosis not present

## 2017-10-25 DIAGNOSIS — L84 Corns and callosities: Secondary | ICD-10-CM

## 2017-10-25 DIAGNOSIS — M79672 Pain in left foot: Secondary | ICD-10-CM

## 2017-10-25 DIAGNOSIS — M79674 Pain in right toe(s): Secondary | ICD-10-CM

## 2017-10-25 NOTE — Progress Notes (Signed)
   Subjective:    Patient ID: Cheryl Wilcox, female    DOB: [birth year], 84 y.o.   MRN: [id number]  HPI    Review of Systems  All other systems reviewed and are negative.      Objective:   Physical Exam        Assessment & Plan:

## 2017-10-25 NOTE — Progress Notes (Signed)
Subjective:   Patient ID: Cheryl Wilcox, female   DOB: 84 y.o.   MRN: [id number]   HPI Patient presents stating of had a lot of pain underneath my left fifth metatarsal and I have a lesion there and I have to take care of my daughter who has cerebral palsy.  Also states her nails are very thickened and they cannot cut and they become painful in shoe gear and patient does not currently smoke and likes to be active   Review of Systems  All other systems reviewed and are negative.       Objective:  Physical Exam  Constitutional: She appears well-developed and well-nourished.  Cardiovascular: Intact distal pulses.  Pulmonary/Chest: Effort normal.  Musculoskeletal: Normal range of motion.  Neurological: She is alert.  Skin: Skin is warm.  Nursing note and vitals reviewed.   Neurovascular status found to be intact muscle strength was adequate range of motion within normal limits with patient found to have keratotic lesion with irritation around the fifth metatarsal left and small lesion second toe right foot.  It is quite sore in the left foot and there is prominence of bone noted in this area and patient was noted to have good digital perfusion and is well oriented x3.  Patient is found to have thickened yellow brittle nailbeds 1-5 both feet that are painful     Assessment:  Plantarflexed fifth metatarsal left with keratotic lesion formation is painful digital keratotic lesion right.  Mycotic nail infection bilateral with pain    Plan:  H&P x-rays of left reviewed and today sterile debridement accomplished with no active drainage and I went ahead and applied padding around this area and I debrided the lesion on the right.  I also debrided painful nailbeds 1-5 both feet with no iatrogenic bleeding noted

## 2017-12-22 IMAGING — CR DG CHEST 2V
2 series · 2 of 2 positions shown · non-contrast
Comparison: 03/24/2017 and prior radiographs

CLINICAL DATA: History of lung cancer.

EXAM:
CHEST  2 VIEW

[w chest lat]
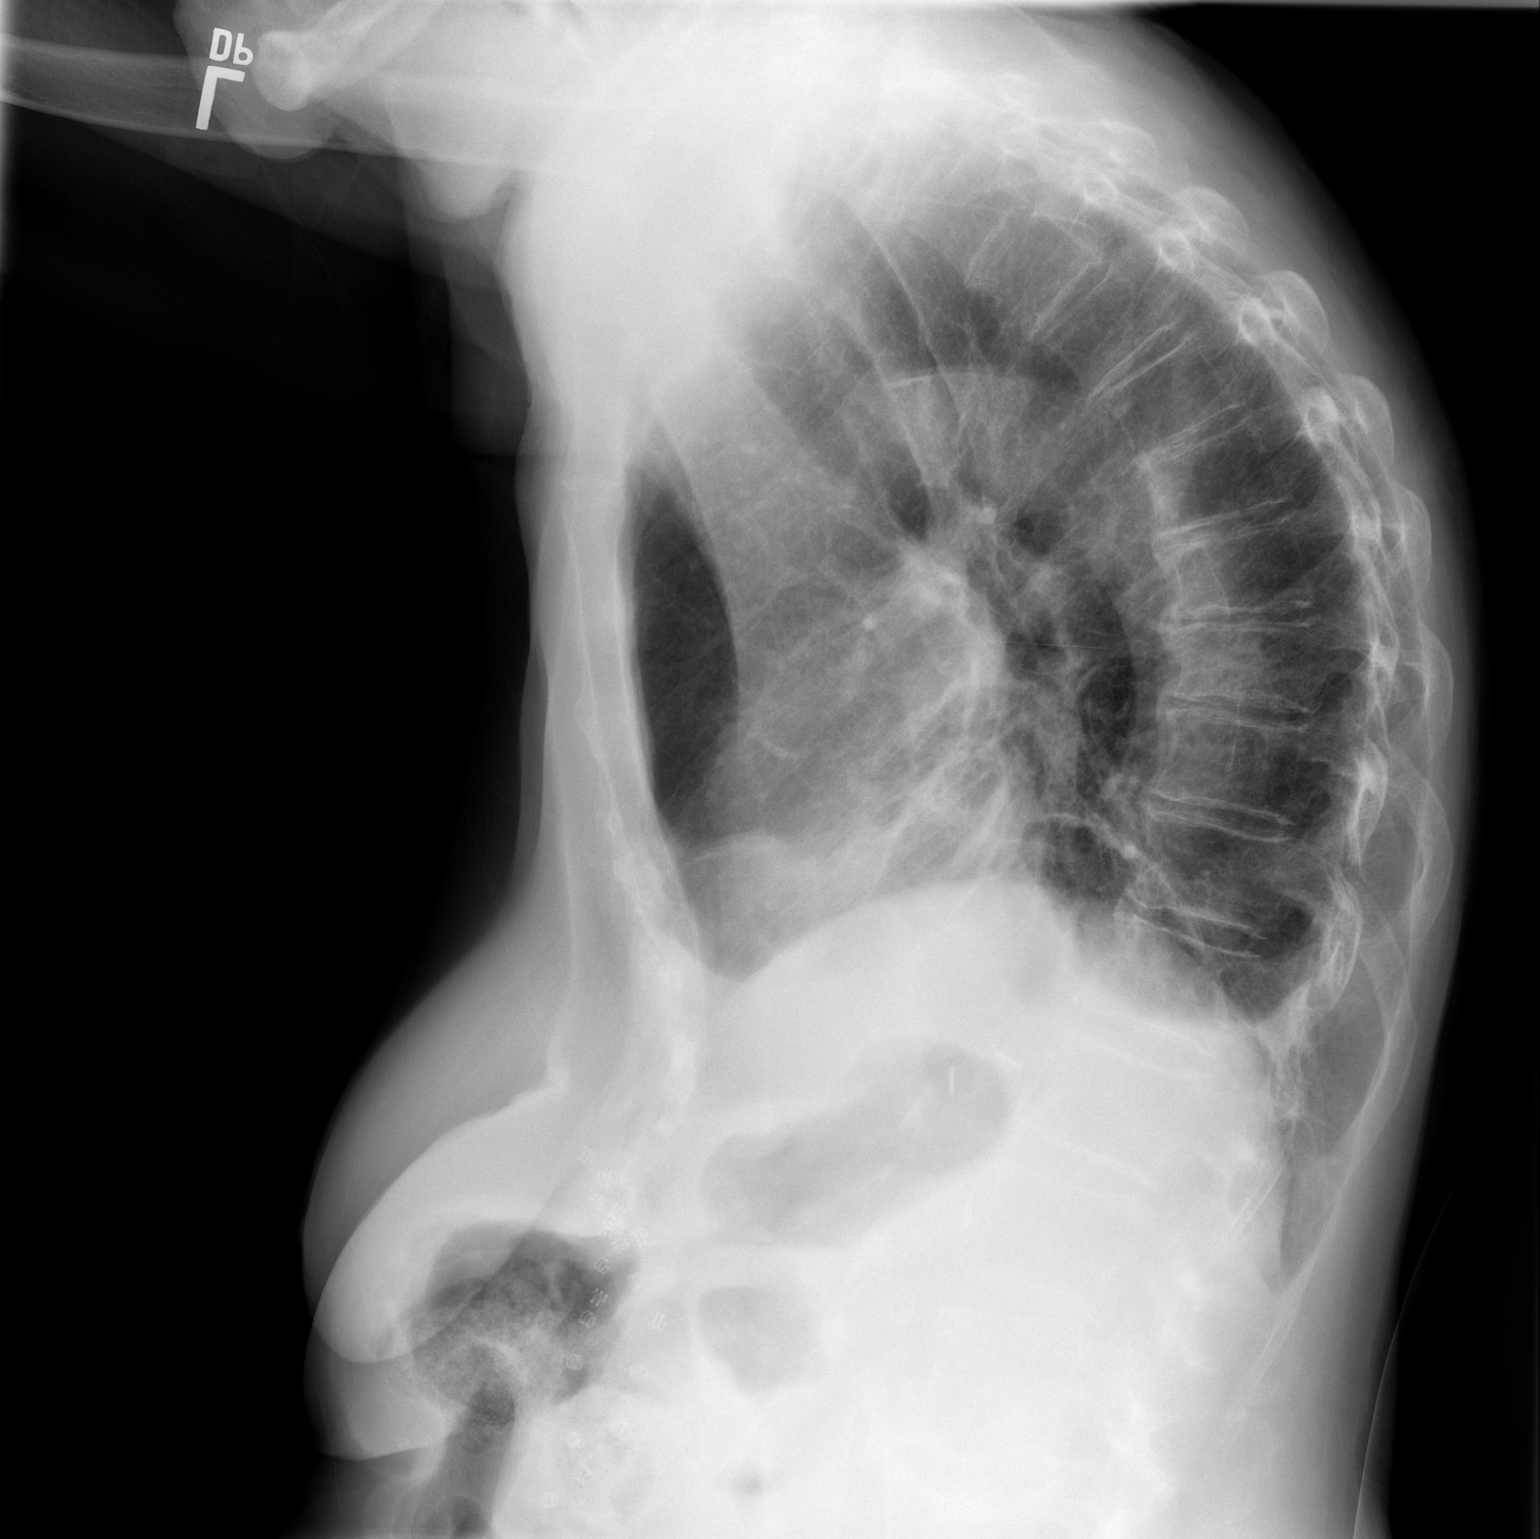

[w chest ap]
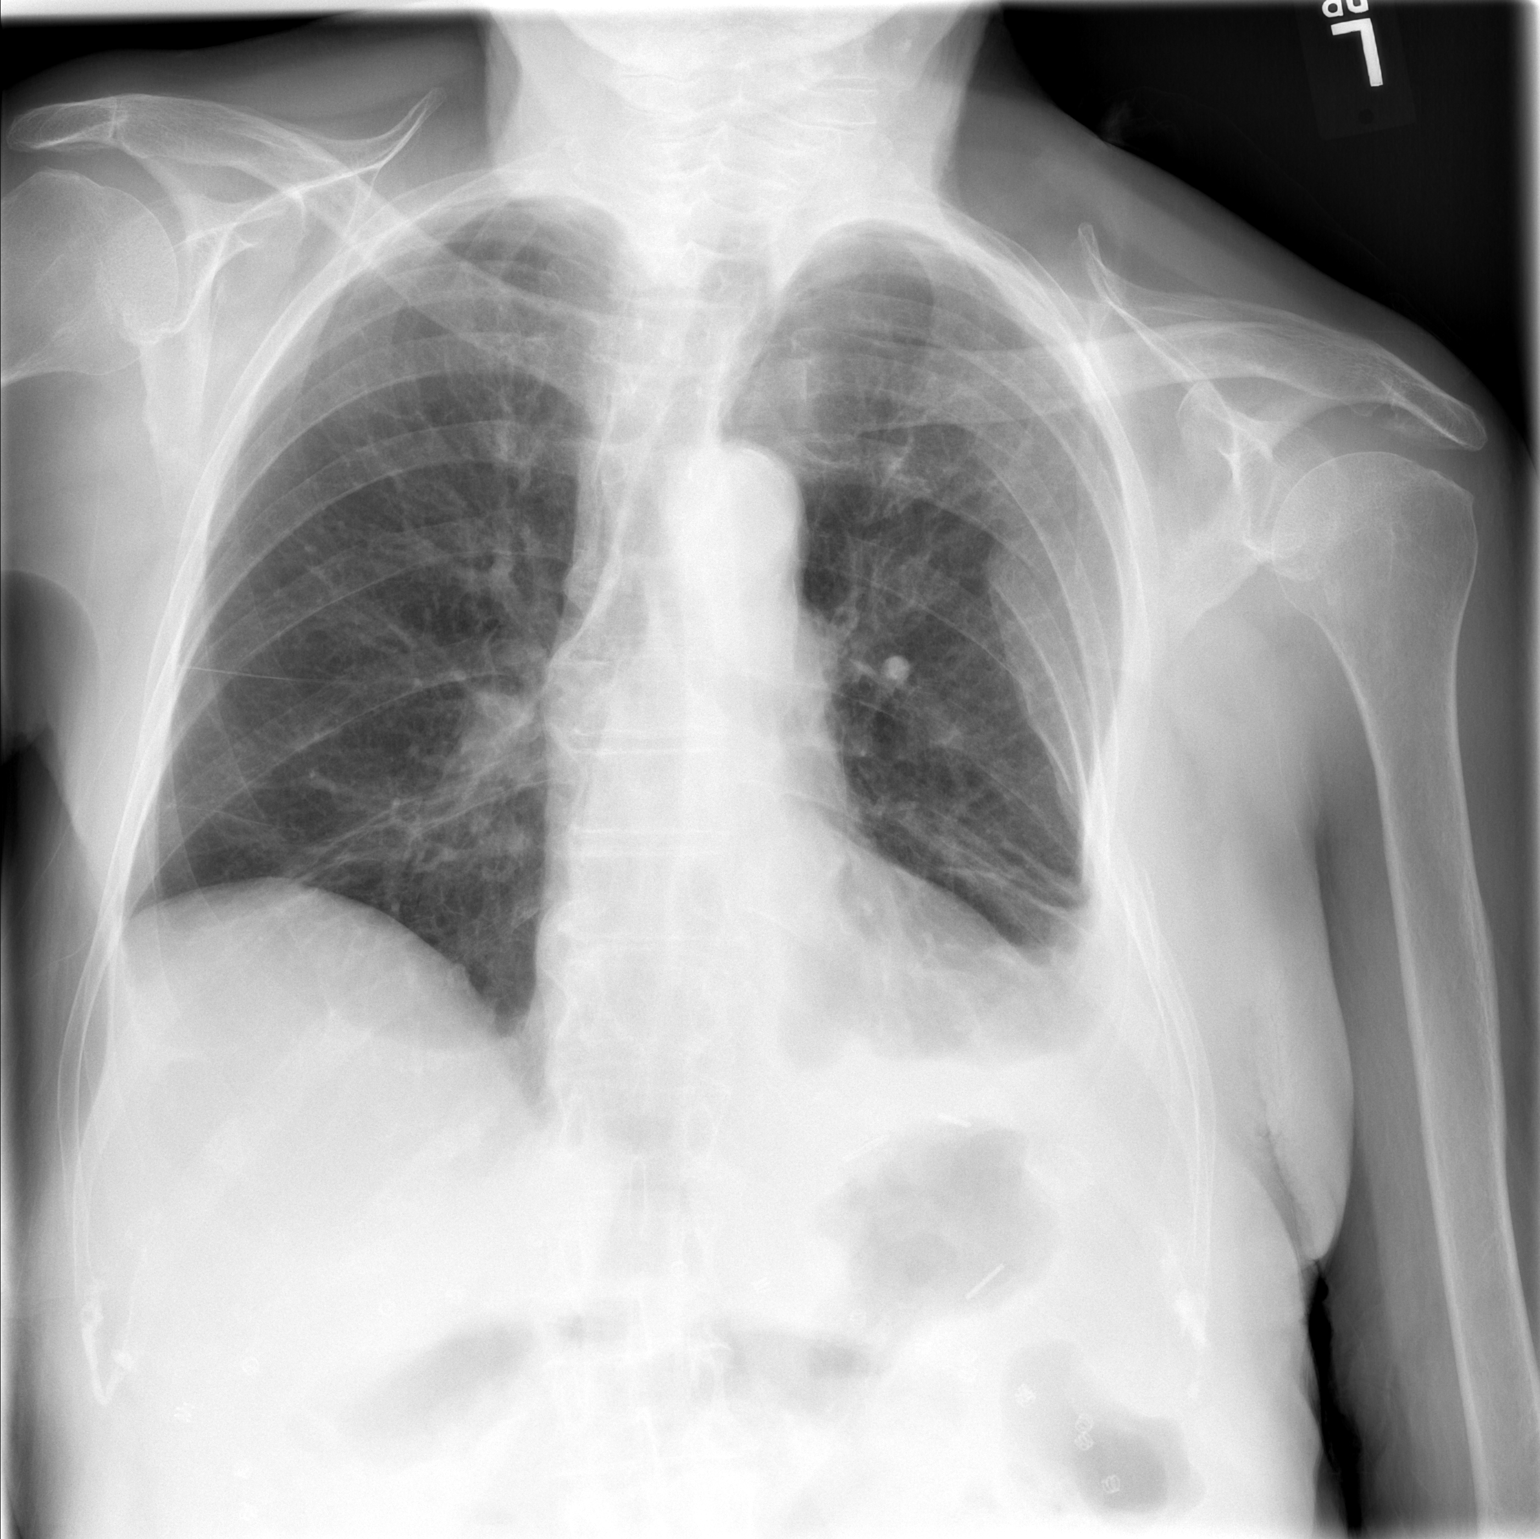

[2 of 2 positions shown; findings below may reference images not displayed]

FINDINGS: The cardiomediastinal silhouette is unremarkable.

Small left pleural effusion and left basilar atelectasis again
noted.

Multiple left-sided rib fractures are again identified.

There is no evidence of pneumothorax.

No other significant change noted.
IMPRESSION: Little significant change since the prior study. Left-sided rib
fractures, small left pleural effusion and left basilar atelectasis
again noted.

## 2018-01-24 ENCOUNTER — Ambulatory Visit: Payer: 59 | Admitting: Podiatry

## 2018-02-04 ENCOUNTER — Other Ambulatory Visit: Payer: Self-pay | Admitting: Family Medicine

## 2018-02-04 DIAGNOSIS — Z1231 Encounter for screening mammogram for malignant neoplasm of breast: Secondary | ICD-10-CM

## 2018-02-15 DIAGNOSIS — Z23 Encounter for immunization: Secondary | ICD-10-CM | POA: Diagnosis not present

## 2018-03-14 ENCOUNTER — Ambulatory Visit
Admission: RE | Admit: 2018-03-14 | Discharge: 2018-03-14 | Disposition: A | Discharge status: Home / Self Care | Payer: Medicare Other | Source: Ambulatory Visit | Attending: Family Medicine | Admitting: Family Medicine

## 2018-03-14 DIAGNOSIS — Z1231 Encounter for screening mammogram for malignant neoplasm of breast: Secondary | ICD-10-CM | POA: Diagnosis not present

## 2018-03-15 ENCOUNTER — Other Ambulatory Visit: Payer: Self-pay | Admitting: Family Medicine

## 2018-03-15 DIAGNOSIS — R928 Other abnormal and inconclusive findings on diagnostic imaging of breast: Secondary | ICD-10-CM

## 2018-03-15 DIAGNOSIS — R921 Mammographic calcification found on diagnostic imaging of breast: Secondary | ICD-10-CM

## 2018-03-21 DIAGNOSIS — Z0000 Encounter for general adult medical examination without abnormal findings: Secondary | ICD-10-CM | POA: Diagnosis not present

## 2018-03-21 DIAGNOSIS — M899 Disorder of bone, unspecified: Secondary | ICD-10-CM | POA: Diagnosis not present

## 2018-03-21 DIAGNOSIS — I129 Hypertensive chronic kidney disease with stage 1 through stage 4 chronic kidney disease, or unspecified chronic kidney disease: Secondary | ICD-10-CM | POA: Diagnosis not present

## 2018-03-21 DIAGNOSIS — Z85038 Personal history of other malignant neoplasm of large intestine: Secondary | ICD-10-CM | POA: Diagnosis not present

## 2018-03-21 DIAGNOSIS — M1990 Unspecified osteoarthritis, unspecified site: Secondary | ICD-10-CM | POA: Diagnosis not present

## 2018-03-21 DIAGNOSIS — E782 Mixed hyperlipidemia: Secondary | ICD-10-CM | POA: Diagnosis not present

## 2018-03-21 DIAGNOSIS — R7301 Impaired fasting glucose: Secondary | ICD-10-CM | POA: Diagnosis not present

## 2018-03-21 DIAGNOSIS — N183 Chronic kidney disease, stage 3 (moderate): Secondary | ICD-10-CM | POA: Diagnosis not present

## 2018-03-25 ENCOUNTER — Ambulatory Visit
Admission: RE | Admit: 2018-03-25 | Discharge: 2018-03-25 | Disposition: A | Discharge status: Home / Self Care | Payer: Medicare Other | Source: Ambulatory Visit | Attending: Family Medicine | Admitting: Family Medicine

## 2018-03-25 ENCOUNTER — Other Ambulatory Visit: Payer: Self-pay | Admitting: Family Medicine

## 2018-03-25 DIAGNOSIS — R921 Mammographic calcification found on diagnostic imaging of breast: Secondary | ICD-10-CM

## 2018-03-25 DIAGNOSIS — R928 Other abnormal and inconclusive findings on diagnostic imaging of breast: Secondary | ICD-10-CM | POA: Diagnosis not present

## 2018-04-04 ENCOUNTER — Ambulatory Visit
Admission: RE | Admit: 2018-04-04 | Discharge: 2018-04-04 | Disposition: A | Discharge status: Home / Self Care | Payer: Medicare Other | Source: Ambulatory Visit | Attending: Family Medicine | Admitting: Family Medicine

## 2018-04-04 DIAGNOSIS — N6311 Unspecified lump in the right breast, upper outer quadrant: Secondary | ICD-10-CM | POA: Diagnosis not present

## 2018-04-04 DIAGNOSIS — N6011 Diffuse cystic mastopathy of right breast: Secondary | ICD-10-CM | POA: Diagnosis not present

## 2018-04-04 DIAGNOSIS — R921 Mammographic calcification found on diagnostic imaging of breast: Secondary | ICD-10-CM

## 2018-04-11 DIAGNOSIS — M8588 Other specified disorders of bone density and structure, other site: Secondary | ICD-10-CM | POA: Diagnosis not present

## 2018-05-20 DIAGNOSIS — M25551 Pain in right hip: Secondary | ICD-10-CM | POA: Diagnosis not present

## 2018-05-24 ENCOUNTER — Ambulatory Visit
Admission: RE | Admit: 2018-05-24 | Discharge: 2018-05-24 | Disposition: A | Discharge status: Home / Self Care | Payer: Medicare Other | Source: Ambulatory Visit | Attending: Family Medicine | Admitting: Family Medicine

## 2018-05-24 ENCOUNTER — Other Ambulatory Visit: Payer: Self-pay | Admitting: Family Medicine

## 2018-05-24 DIAGNOSIS — M25552 Pain in left hip: Principal | ICD-10-CM

## 2018-05-24 DIAGNOSIS — M25551 Pain in right hip: Secondary | ICD-10-CM

## 2018-05-24 DIAGNOSIS — M1612 Unilateral primary osteoarthritis, left hip: Secondary | ICD-10-CM | POA: Diagnosis not present

## 2018-05-27 ENCOUNTER — Ambulatory Visit (INDEPENDENT_AMBULATORY_CARE_PROVIDER_SITE_OTHER): Payer: Medicare Other | Admitting: Orthopaedic Surgery

## 2018-05-27 ENCOUNTER — Encounter (INDEPENDENT_AMBULATORY_CARE_PROVIDER_SITE_OTHER): Payer: Self-pay | Admitting: Orthopaedic Surgery

## 2018-05-27 VITALS — BP 157/89 | HR 116 | Ht 61.0 in | Wt 140.0 lb

## 2018-05-27 DIAGNOSIS — M25551 Pain in right hip: Secondary | ICD-10-CM | POA: Diagnosis not present

## 2018-05-27 DIAGNOSIS — M25552 Pain in left hip: Secondary | ICD-10-CM

## 2018-05-27 MED ORDER — TRAMADOL HCL 50 MG PO TABS: 50.0000 mg | ORAL_TABLET | Freq: Two times a day (BID) | ORAL | 0 refills | Status: DC | PRN

## 2018-05-27 NOTE — Progress Notes (Signed)
Office Visit Note   Patient: Cheryl Wilcox           Date of Birth: [birth year]           MRN: [id number] Visit Date: 05/27/2018              Requested by: Mayra Neer, MD [address] [place], [place] [address] PCP: Mayra Neer, MD   Assessment & Plan: Visit Diagnoses:  1. Pain of both hip joints     Plan: We will set up for some home physical therapy since she is housebound.  She has a Actuary and they will work with some lower extremity strengthening see if we can improve her ambulation potential.  I plan to recheck her in 3 weeks.  Tramadol 20 tablets 1 p.o. twice daily prescribed for her pain.  We have asked her to use it sparingly.  Follow-Up Instructions: Return in about 1 week (around 06/03/2018).   Orders:  No orders of the defined types were placed in this encounter.  Meds ordered this encounter  Medications  . traMADol (ULTRAM) 50 MG tablet    Sig: Take 1 tablet (50 mg total) by mouth every 12 (twelve) hours as needed.    Dispense:  20 tablet    Refill:  0      Procedures: No procedures performed   Clinical Data: No additional findings.   Subjective: Chief Complaint  Patient presents with  . Right Hip - Pain  . Left Hip - Pain    HPI 84 year old with bilateral hip pain less worse than right.  She has a lift chair she now requires assistance for some to get her up she has around-the-clock sitter and when she walks she has been dragging her feet and having severe pain.  She had some Ultram has 6 tablets left was taking 1 3 times a day.  She does not have any pain when she sitting she hurts when she stands up.  X-rays obtained 1227 the hips and pelvis demonstrated mild hip arthritis slightly worse on left than right hip negative for acute fracture.  Some lumbar facet arthritis was noted at L4-5 worse on the right than left.  No fever chills no dysuria.  Sitters with her states she has been walking with a shuffling gait dragging her  feet.  Review of Systems unit updated noncontributory.  Of note is history of kidney disease hypertension hyperlipidemia, arthritis.   Objective: Vital Signs: BP (!) 157/89   Pulse (!) 116   Ht 5\' 1"  (1.549 m)   Wt 140 lb (63.5 kg)   BMI 26.45 kg/m   Physical Exam Constitutional:      Appearance: She is well-developed.  HENT:     Head: Normocephalic.     Right Ear: External ear normal.     Left Ear: External ear normal.  Eyes:     Pupils: Pupils are equal, round, and reactive to light.  Neck:     Thyroid: No thyromegaly.     Trachea: No tracheal deviation.  Cardiovascular:     Rate and Rhythm: Normal rate.  Pulmonary:     Effort: Pulmonary effort is normal.  Abdominal:     Palpations: Abdomen is soft.  Skin:    General: Skin is warm and dry.  Neurological:     Mental Status: She is alert and oriented to person, place, and time.  Psychiatric:        Behavior: Behavior normal.  Ortho Exam no pain with internal or external rotation of her hips no hip flexion contracture.  No tenderness over the sciatic notch mild tenderness over the trochanters which is mild to minimal.  Anterior tib gastrocsoleus is active.  Distal pulses palpable. Specialty Comments:  No specialty comments available.  Imaging: No results found.   PMFS History: Patient Active Problem List   Diagnosis Date Noted  . Hypertension   . Arthritis   . Hyperlipidemia   . Chronic kidney disease   . Cancer [facility])    Past Medical History:  Diagnosis Date  . Arthritis    OSTEO  . Cancer ([place])    COLON 2002  . Chronic kidney disease    CKD STAGE III  . Hyperlipidemia   . Hypertension     No family history on file.  Past Surgical History:  Procedure Laterality Date  . CARAL TUNNEL Right 11/2015  . COLON SURGERY  2002   LEFT HEMICOLECTOMY WITH SPLENECTOMY...STAGE I  . HAND SURGERY     FOR  [facility]   Social History   Occupational History  . Not on file  Tobacco Use  . Smoking status:  Never Smoker  . Smokeless tobacco: Never Used  Substance and Sexual Activity  . Alcohol use: No  . Drug use: Not on file  . Sexual activity: Not on file

## 2018-05-27 NOTE — Addendum Note (Signed)
Addended by: Meyer Cory on: 05/27/2018 05:26 PM   Modules accepted: Orders

## 2018-06-03 ENCOUNTER — Telehealth (INDEPENDENT_AMBULATORY_CARE_PROVIDER_SITE_OTHER): Payer: Self-pay | Admitting: Orthopaedic Surgery

## 2018-06-03 NOTE — Telephone Encounter (Signed)
Cheryl Wilcox (PT) called needing verbal orders for HHPT 2 wk 4  Stacy said she called and left voicemail message 05/30/18 needing verbal orders.  The number to contact Marzetta Board is [phone]

## 2018-06-03 NOTE — Telephone Encounter (Signed)
Ok for orders? 

## 2018-06-03 NOTE — Telephone Encounter (Signed)
Yes Ok thank you

## 2018-06-03 NOTE — Telephone Encounter (Signed)
I called Stacy and advised.

## 2018-06-05 ENCOUNTER — Other Ambulatory Visit (INDEPENDENT_AMBULATORY_CARE_PROVIDER_SITE_OTHER): Payer: Self-pay | Admitting: Orthopaedic Surgery

## 2018-06-05 NOTE — Telephone Encounter (Signed)
Called to pharmacy 

## 2018-06-05 NOTE — Telephone Encounter (Signed)
oK LAST REFILL THANKS

## 2018-06-05 NOTE — Telephone Encounter (Signed)
[facility] for refill? Last filled 05/27/2018 #20 1 po bid.  Per request, patient requests #20 tabs.

## 2018-06-10 ENCOUNTER — Telehealth (INDEPENDENT_AMBULATORY_CARE_PROVIDER_SITE_OTHER): Payer: Self-pay | Admitting: Orthopaedic Surgery

## 2018-06-10 DIAGNOSIS — M25552 Pain in left hip: Principal | ICD-10-CM

## 2018-06-10 DIAGNOSIS — M25551 Pain in right hip: Secondary | ICD-10-CM

## 2018-06-10 NOTE — Telephone Encounter (Signed)
Pts son called wanting to switch from [facility] to [facility] and you wanna social work to the order as well.  PT # is [phone]-- [name]

## 2018-06-10 NOTE — Telephone Encounter (Signed)
They can choose the home care agency they want thanks [place] to do.

## 2018-06-10 NOTE — Telephone Encounter (Signed)
Please advise 

## 2018-06-12 NOTE — Telephone Encounter (Signed)
Can you please send in referral to [facility].  Son also wants referral to include social work. I am in [place] office and have limited access to be able to send referral out for Dr. Lorin Wilcox. Thanks.

## 2018-06-13 NOTE — Telephone Encounter (Signed)
Referral emailed to [facility] and asked about taking over care.

## 2018-06-15 DIAGNOSIS — M160 Bilateral primary osteoarthritis of hip: Secondary | ICD-10-CM | POA: Diagnosis not present

## 2018-06-15 DIAGNOSIS — I129 Hypertensive chronic kidney disease with stage 1 through stage 4 chronic kidney disease, or unspecified chronic kidney disease: Secondary | ICD-10-CM | POA: Diagnosis not present

## 2018-06-15 DIAGNOSIS — M47816 Spondylosis without myelopathy or radiculopathy, lumbar region: Secondary | ICD-10-CM | POA: Diagnosis not present

## 2018-06-15 DIAGNOSIS — Z85038 Personal history of other malignant neoplasm of large intestine: Secondary | ICD-10-CM | POA: Diagnosis not present

## 2018-06-15 DIAGNOSIS — E785 Hyperlipidemia, unspecified: Secondary | ICD-10-CM | POA: Diagnosis not present

## 2018-06-15 DIAGNOSIS — N183 Chronic kidney disease, stage 3 (moderate): Secondary | ICD-10-CM | POA: Diagnosis not present

## 2018-06-17 ENCOUNTER — Telehealth (INDEPENDENT_AMBULATORY_CARE_PROVIDER_SITE_OTHER): Payer: Self-pay | Admitting: Orthopaedic Surgery

## 2018-06-17 NOTE — Telephone Encounter (Signed)
Ok for orders? 

## 2018-06-17 NOTE — Telephone Encounter (Signed)
Ok thanks 

## 2018-06-17 NOTE — Telephone Encounter (Signed)
Verdis Frederickson called and needed verbal orders. They are as follows:  1x for 1week 2x for  6weeks For PT. Please call Verdis Frederickson back

## 2018-06-17 NOTE — Telephone Encounter (Signed)
I called Verdis Frederickson and advised.

## 2018-06-18 ENCOUNTER — Ambulatory Visit (INDEPENDENT_AMBULATORY_CARE_PROVIDER_SITE_OTHER): Payer: Medicare Other | Admitting: Orthopaedic Surgery

## 2018-06-18 ENCOUNTER — Telehealth (INDEPENDENT_AMBULATORY_CARE_PROVIDER_SITE_OTHER): Payer: Self-pay | Admitting: Orthopaedic Surgery

## 2018-06-18 DIAGNOSIS — I129 Hypertensive chronic kidney disease with stage 1 through stage 4 chronic kidney disease, or unspecified chronic kidney disease: Secondary | ICD-10-CM | POA: Diagnosis not present

## 2018-06-18 DIAGNOSIS — Z85038 Personal history of other malignant neoplasm of large intestine: Secondary | ICD-10-CM | POA: Diagnosis not present

## 2018-06-18 DIAGNOSIS — E785 Hyperlipidemia, unspecified: Secondary | ICD-10-CM | POA: Diagnosis not present

## 2018-06-18 DIAGNOSIS — M160 Bilateral primary osteoarthritis of hip: Secondary | ICD-10-CM | POA: Diagnosis not present

## 2018-06-18 DIAGNOSIS — M47816 Spondylosis without myelopathy or radiculopathy, lumbar region: Secondary | ICD-10-CM | POA: Diagnosis not present

## 2018-06-18 DIAGNOSIS — N183 Chronic kidney disease, stage 3 (moderate): Secondary | ICD-10-CM | POA: Diagnosis not present

## 2018-06-18 NOTE — Telephone Encounter (Signed)
Cindy-nurse from [facility] called needing orders to add a nurse and Education officer, museum. Jenny Reichmann also asked if she could get an approval to reach out to patient's PCP? The number to contact Jenny Reichmann is [phone]

## 2018-06-18 NOTE — Telephone Encounter (Signed)
I called Jenny Reichmann she is already left for the day.  Will retry tomorrow

## 2018-06-18 NOTE — Telephone Encounter (Signed)
[facility] for orders? Patient's son requested Education officer, museum.

## 2018-06-19 NOTE — Telephone Encounter (Signed)
I called back she is not working today.  Patient needed some physical therapy to get better.  I am not sure what she needs a nurse or Education officer, museum for.  The home care nurse if she has questions about medications she can always call the patient's PCP.  You call thanks

## 2018-06-20 DIAGNOSIS — Z85038 Personal history of other malignant neoplasm of large intestine: Secondary | ICD-10-CM | POA: Diagnosis not present

## 2018-06-20 DIAGNOSIS — E785 Hyperlipidemia, unspecified: Secondary | ICD-10-CM | POA: Diagnosis not present

## 2018-06-20 DIAGNOSIS — I129 Hypertensive chronic kidney disease with stage 1 through stage 4 chronic kidney disease, or unspecified chronic kidney disease: Secondary | ICD-10-CM | POA: Diagnosis not present

## 2018-06-20 DIAGNOSIS — N183 Chronic kidney disease, stage 3 (moderate): Secondary | ICD-10-CM | POA: Diagnosis not present

## 2018-06-20 DIAGNOSIS — M160 Bilateral primary osteoarthritis of hip: Secondary | ICD-10-CM | POA: Diagnosis not present

## 2018-06-20 DIAGNOSIS — M47816 Spondylosis without myelopathy or radiculopathy, lumbar region: Secondary | ICD-10-CM | POA: Diagnosis not present

## 2018-06-25 DIAGNOSIS — Z85038 Personal history of other malignant neoplasm of large intestine: Secondary | ICD-10-CM | POA: Diagnosis not present

## 2018-06-25 DIAGNOSIS — N183 Chronic kidney disease, stage 3 (moderate): Secondary | ICD-10-CM | POA: Diagnosis not present

## 2018-06-25 DIAGNOSIS — I129 Hypertensive chronic kidney disease with stage 1 through stage 4 chronic kidney disease, or unspecified chronic kidney disease: Secondary | ICD-10-CM | POA: Diagnosis not present

## 2018-06-25 DIAGNOSIS — E785 Hyperlipidemia, unspecified: Secondary | ICD-10-CM | POA: Diagnosis not present

## 2018-06-25 DIAGNOSIS — M47816 Spondylosis without myelopathy or radiculopathy, lumbar region: Secondary | ICD-10-CM | POA: Diagnosis not present

## 2018-06-25 DIAGNOSIS — M160 Bilateral primary osteoarthritis of hip: Secondary | ICD-10-CM | POA: Diagnosis not present

## 2018-06-27 DIAGNOSIS — M160 Bilateral primary osteoarthritis of hip: Secondary | ICD-10-CM | POA: Diagnosis not present

## 2018-06-27 DIAGNOSIS — E785 Hyperlipidemia, unspecified: Secondary | ICD-10-CM | POA: Diagnosis not present

## 2018-06-27 DIAGNOSIS — N183 Chronic kidney disease, stage 3 (moderate): Secondary | ICD-10-CM | POA: Diagnosis not present

## 2018-06-27 DIAGNOSIS — M47816 Spondylosis without myelopathy or radiculopathy, lumbar region: Secondary | ICD-10-CM | POA: Diagnosis not present

## 2018-06-27 DIAGNOSIS — Z85038 Personal history of other malignant neoplasm of large intestine: Secondary | ICD-10-CM | POA: Diagnosis not present

## 2018-06-27 DIAGNOSIS — I129 Hypertensive chronic kidney disease with stage 1 through stage 4 chronic kidney disease, or unspecified chronic kidney disease: Secondary | ICD-10-CM | POA: Diagnosis not present

## 2018-06-28 DIAGNOSIS — I129 Hypertensive chronic kidney disease with stage 1 through stage 4 chronic kidney disease, or unspecified chronic kidney disease: Secondary | ICD-10-CM | POA: Diagnosis not present

## 2018-06-28 DIAGNOSIS — Z85038 Personal history of other malignant neoplasm of large intestine: Secondary | ICD-10-CM | POA: Diagnosis not present

## 2018-06-28 DIAGNOSIS — N183 Chronic kidney disease, stage 3 (moderate): Secondary | ICD-10-CM | POA: Diagnosis not present

## 2018-06-28 DIAGNOSIS — E785 Hyperlipidemia, unspecified: Secondary | ICD-10-CM | POA: Diagnosis not present

## 2018-06-28 DIAGNOSIS — M160 Bilateral primary osteoarthritis of hip: Secondary | ICD-10-CM | POA: Diagnosis not present

## 2018-06-28 DIAGNOSIS — M47816 Spondylosis without myelopathy or radiculopathy, lumbar region: Secondary | ICD-10-CM | POA: Diagnosis not present

## 2018-07-01 DIAGNOSIS — I129 Hypertensive chronic kidney disease with stage 1 through stage 4 chronic kidney disease, or unspecified chronic kidney disease: Secondary | ICD-10-CM | POA: Diagnosis not present

## 2018-07-01 DIAGNOSIS — N183 Chronic kidney disease, stage 3 (moderate): Secondary | ICD-10-CM | POA: Diagnosis not present

## 2018-07-01 DIAGNOSIS — M47816 Spondylosis without myelopathy or radiculopathy, lumbar region: Secondary | ICD-10-CM | POA: Diagnosis not present

## 2018-07-01 DIAGNOSIS — E785 Hyperlipidemia, unspecified: Secondary | ICD-10-CM | POA: Diagnosis not present

## 2018-07-01 DIAGNOSIS — M160 Bilateral primary osteoarthritis of hip: Secondary | ICD-10-CM | POA: Diagnosis not present

## 2018-07-01 DIAGNOSIS — Z85038 Personal history of other malignant neoplasm of large intestine: Secondary | ICD-10-CM | POA: Diagnosis not present

## 2018-07-02 ENCOUNTER — Encounter (INDEPENDENT_AMBULATORY_CARE_PROVIDER_SITE_OTHER): Payer: Self-pay | Admitting: Orthopaedic Surgery

## 2018-07-02 ENCOUNTER — Ambulatory Visit (INDEPENDENT_AMBULATORY_CARE_PROVIDER_SITE_OTHER): Payer: Medicare Other | Admitting: Orthopaedic Surgery

## 2018-07-02 VITALS — BP 149/85 | HR 116 | Ht 61.0 in | Wt 140.0 lb

## 2018-07-02 DIAGNOSIS — N183 Chronic kidney disease, stage 3 (moderate): Secondary | ICD-10-CM | POA: Diagnosis not present

## 2018-07-02 DIAGNOSIS — L84 Corns and callosities: Secondary | ICD-10-CM

## 2018-07-02 DIAGNOSIS — M47816 Spondylosis without myelopathy or radiculopathy, lumbar region: Secondary | ICD-10-CM | POA: Diagnosis not present

## 2018-07-02 DIAGNOSIS — R29898 Other symptoms and signs involving the musculoskeletal system: Secondary | ICD-10-CM | POA: Diagnosis not present

## 2018-07-02 DIAGNOSIS — I129 Hypertensive chronic kidney disease with stage 1 through stage 4 chronic kidney disease, or unspecified chronic kidney disease: Secondary | ICD-10-CM | POA: Diagnosis not present

## 2018-07-02 DIAGNOSIS — M160 Bilateral primary osteoarthritis of hip: Secondary | ICD-10-CM | POA: Diagnosis not present

## 2018-07-02 DIAGNOSIS — E785 Hyperlipidemia, unspecified: Secondary | ICD-10-CM | POA: Diagnosis not present

## 2018-07-02 DIAGNOSIS — Z85038 Personal history of other malignant neoplasm of large intestine: Secondary | ICD-10-CM | POA: Diagnosis not present

## 2018-07-02 NOTE — Progress Notes (Signed)
Office Visit Note   Patient: Cheryl Wilcox           Date of Birth: 30-Jun-1927           MRN: [id number] Visit Date: 07/02/2018              Requested by: Mayra Neer, MD [address] [place], [place] [address] PCP: Mayra Neer, MD   Assessment & Plan: Visit Diagnoses:  1. Callus of foot   2. Weakness of both hips     Plan: Old blood blister and overlying thick callus was debrided.  Bactroban and dressing perform sterile scalpel 10 blade was used.  We discussed foot care using a file if she has some re-formation of callus in the area.  Return PRN.  She is gotten good improvement in her ambulation with the home physical therapy.  Follow-Up Instructions: Return if symptoms worsen or fail to improve.   Orders:  No orders of the defined types were placed in this encounter.  No orders of the defined types were placed in this encounter.     Procedures: No procedures performed   Clinical Data: No additional findings.   Subjective: Chief Complaint  Patient presents with  . Right Hip - Follow-up  . Left Hip - Follow-up    HPI patient returns she is doing home physical therapy is gotten better.  She has to sleep in her lift chair since she can get up out of a regular bed.  She ambulates with a walker and has had problems with left foot painful callus with underlying blood blister which is been treated by a nurse comes and sees her at the house.  Review of Systems updated unchanged from last office visit.   Objective: Vital Signs: BP (!) 149/85   Pulse (!) 116   Ht 5\' 1"  (1.549 m)   Wt 140 lb (63.5 kg)   BMI 26.45 kg/m   Physical Exam Constitutional:      Appearance: She is well-developed.  HENT:     Head: Normocephalic.     Right Ear: External ear normal.     Left Ear: External ear normal.  Eyes:     Pupils: Pupils are equal, round, and reactive to light.  Neck:     Thyroid: No thyromegaly.     Trachea: No tracheal deviation.    Cardiovascular:     Rate and Rhythm: Normal rate.  Pulmonary:     Effort: Pulmonary effort is normal.  Abdominal:     Palpations: Abdomen is soft.  Skin:    General: Skin is warm and dry.  Neurological:     Mental Status: She is alert and oriented to person, place, and time.  Psychiatric:        Behavior: Behavior normal.     Ortho Exam painful 2 cm blood blister with thick callus overlying it.  After verbal informed consent this was sharply debrided down to some bleeding tissue.  Mupirocin ointment applied and small dressing.  She will leave it on till tomorrow and then can remove it for bathing and then apply Band-Aid.  Specialty Comments:  No specialty comments available.  Imaging: No results found.   PMFS History: Patient Active Problem List   Diagnosis Date Noted  . Hypertension   . Arthritis   . Hyperlipidemia   . Chronic kidney disease   . Cancer [facility])    Past Medical History:  Diagnosis Date  . Arthritis    OSTEO  .  Cancer ([place])    COLON 2002  . Chronic kidney disease    CKD STAGE III  . Hyperlipidemia   . Hypertension     No family history on file.  Past Surgical History:  Procedure Laterality Date  . CARAL TUNNEL Right 11/2015  . COLON SURGERY  2002   LEFT HEMICOLECTOMY WITH SPLENECTOMY...STAGE I  . HAND SURGERY     FOR  [facility]   Social History   Occupational History  . Not on file  Tobacco Use  . Smoking status: Never Smoker  . Smokeless tobacco: Never Used  Substance and Sexual Activity  . Alcohol use: No  . Drug use: Not on file  . Sexual activity: Not on file

## 2018-07-03 DIAGNOSIS — M47816 Spondylosis without myelopathy or radiculopathy, lumbar region: Secondary | ICD-10-CM | POA: Diagnosis not present

## 2018-07-03 DIAGNOSIS — E785 Hyperlipidemia, unspecified: Secondary | ICD-10-CM | POA: Diagnosis not present

## 2018-07-03 DIAGNOSIS — N183 Chronic kidney disease, stage 3 (moderate): Secondary | ICD-10-CM | POA: Diagnosis not present

## 2018-07-03 DIAGNOSIS — Z85038 Personal history of other malignant neoplasm of large intestine: Secondary | ICD-10-CM | POA: Diagnosis not present

## 2018-07-03 DIAGNOSIS — I129 Hypertensive chronic kidney disease with stage 1 through stage 4 chronic kidney disease, or unspecified chronic kidney disease: Secondary | ICD-10-CM | POA: Diagnosis not present

## 2018-07-03 DIAGNOSIS — M160 Bilateral primary osteoarthritis of hip: Secondary | ICD-10-CM | POA: Diagnosis not present

## 2018-07-04 DIAGNOSIS — Z85038 Personal history of other malignant neoplasm of large intestine: Secondary | ICD-10-CM | POA: Diagnosis not present

## 2018-07-04 DIAGNOSIS — E785 Hyperlipidemia, unspecified: Secondary | ICD-10-CM | POA: Diagnosis not present

## 2018-07-04 DIAGNOSIS — M160 Bilateral primary osteoarthritis of hip: Secondary | ICD-10-CM | POA: Diagnosis not present

## 2018-07-04 DIAGNOSIS — M47816 Spondylosis without myelopathy or radiculopathy, lumbar region: Secondary | ICD-10-CM | POA: Diagnosis not present

## 2018-07-04 DIAGNOSIS — N183 Chronic kidney disease, stage 3 (moderate): Secondary | ICD-10-CM | POA: Diagnosis not present

## 2018-07-04 DIAGNOSIS — I129 Hypertensive chronic kidney disease with stage 1 through stage 4 chronic kidney disease, or unspecified chronic kidney disease: Secondary | ICD-10-CM | POA: Diagnosis not present

## 2018-07-08 DIAGNOSIS — E785 Hyperlipidemia, unspecified: Secondary | ICD-10-CM | POA: Diagnosis not present

## 2018-07-08 DIAGNOSIS — M160 Bilateral primary osteoarthritis of hip: Secondary | ICD-10-CM | POA: Diagnosis not present

## 2018-07-08 DIAGNOSIS — M47816 Spondylosis without myelopathy or radiculopathy, lumbar region: Secondary | ICD-10-CM | POA: Diagnosis not present

## 2018-07-08 DIAGNOSIS — N183 Chronic kidney disease, stage 3 (moderate): Secondary | ICD-10-CM | POA: Diagnosis not present

## 2018-07-08 DIAGNOSIS — I129 Hypertensive chronic kidney disease with stage 1 through stage 4 chronic kidney disease, or unspecified chronic kidney disease: Secondary | ICD-10-CM | POA: Diagnosis not present

## 2018-07-08 DIAGNOSIS — Z85038 Personal history of other malignant neoplasm of large intestine: Secondary | ICD-10-CM | POA: Diagnosis not present

## 2018-07-09 DIAGNOSIS — M47816 Spondylosis without myelopathy or radiculopathy, lumbar region: Secondary | ICD-10-CM | POA: Diagnosis not present

## 2018-07-09 DIAGNOSIS — Z85038 Personal history of other malignant neoplasm of large intestine: Secondary | ICD-10-CM | POA: Diagnosis not present

## 2018-07-09 DIAGNOSIS — M160 Bilateral primary osteoarthritis of hip: Secondary | ICD-10-CM | POA: Diagnosis not present

## 2018-07-09 DIAGNOSIS — I129 Hypertensive chronic kidney disease with stage 1 through stage 4 chronic kidney disease, or unspecified chronic kidney disease: Secondary | ICD-10-CM | POA: Diagnosis not present

## 2018-07-09 DIAGNOSIS — E785 Hyperlipidemia, unspecified: Secondary | ICD-10-CM | POA: Diagnosis not present

## 2018-07-09 DIAGNOSIS — N183 Chronic kidney disease, stage 3 (moderate): Secondary | ICD-10-CM | POA: Diagnosis not present

## 2018-07-11 DIAGNOSIS — M160 Bilateral primary osteoarthritis of hip: Secondary | ICD-10-CM | POA: Diagnosis not present

## 2018-07-11 DIAGNOSIS — M47816 Spondylosis without myelopathy or radiculopathy, lumbar region: Secondary | ICD-10-CM | POA: Diagnosis not present

## 2018-07-11 DIAGNOSIS — Z85038 Personal history of other malignant neoplasm of large intestine: Secondary | ICD-10-CM | POA: Diagnosis not present

## 2018-07-11 DIAGNOSIS — I129 Hypertensive chronic kidney disease with stage 1 through stage 4 chronic kidney disease, or unspecified chronic kidney disease: Secondary | ICD-10-CM | POA: Diagnosis not present

## 2018-07-11 DIAGNOSIS — N183 Chronic kidney disease, stage 3 (moderate): Secondary | ICD-10-CM | POA: Diagnosis not present

## 2018-07-11 DIAGNOSIS — E785 Hyperlipidemia, unspecified: Secondary | ICD-10-CM | POA: Diagnosis not present

## 2018-07-12 DIAGNOSIS — N183 Chronic kidney disease, stage 3 (moderate): Secondary | ICD-10-CM | POA: Diagnosis not present

## 2018-07-12 DIAGNOSIS — I129 Hypertensive chronic kidney disease with stage 1 through stage 4 chronic kidney disease, or unspecified chronic kidney disease: Secondary | ICD-10-CM | POA: Diagnosis not present

## 2018-07-12 DIAGNOSIS — Z85038 Personal history of other malignant neoplasm of large intestine: Secondary | ICD-10-CM | POA: Diagnosis not present

## 2018-07-12 DIAGNOSIS — M160 Bilateral primary osteoarthritis of hip: Secondary | ICD-10-CM | POA: Diagnosis not present

## 2018-07-12 DIAGNOSIS — M47816 Spondylosis without myelopathy or radiculopathy, lumbar region: Secondary | ICD-10-CM | POA: Diagnosis not present

## 2018-07-12 DIAGNOSIS — E785 Hyperlipidemia, unspecified: Secondary | ICD-10-CM | POA: Diagnosis not present

## 2018-07-15 DIAGNOSIS — I129 Hypertensive chronic kidney disease with stage 1 through stage 4 chronic kidney disease, or unspecified chronic kidney disease: Secondary | ICD-10-CM | POA: Diagnosis not present

## 2018-07-15 DIAGNOSIS — E785 Hyperlipidemia, unspecified: Secondary | ICD-10-CM | POA: Diagnosis not present

## 2018-07-15 DIAGNOSIS — Z85038 Personal history of other malignant neoplasm of large intestine: Secondary | ICD-10-CM | POA: Diagnosis not present

## 2018-07-15 DIAGNOSIS — M47816 Spondylosis without myelopathy or radiculopathy, lumbar region: Secondary | ICD-10-CM | POA: Diagnosis not present

## 2018-07-15 DIAGNOSIS — M160 Bilateral primary osteoarthritis of hip: Secondary | ICD-10-CM | POA: Diagnosis not present

## 2018-07-15 DIAGNOSIS — N183 Chronic kidney disease, stage 3 (moderate): Secondary | ICD-10-CM | POA: Diagnosis not present

## 2018-07-16 DIAGNOSIS — M160 Bilateral primary osteoarthritis of hip: Secondary | ICD-10-CM | POA: Diagnosis not present

## 2018-07-16 DIAGNOSIS — E785 Hyperlipidemia, unspecified: Secondary | ICD-10-CM | POA: Diagnosis not present

## 2018-07-16 DIAGNOSIS — Z85038 Personal history of other malignant neoplasm of large intestine: Secondary | ICD-10-CM | POA: Diagnosis not present

## 2018-07-16 DIAGNOSIS — M47816 Spondylosis without myelopathy or radiculopathy, lumbar region: Secondary | ICD-10-CM | POA: Diagnosis not present

## 2018-07-16 DIAGNOSIS — I129 Hypertensive chronic kidney disease with stage 1 through stage 4 chronic kidney disease, or unspecified chronic kidney disease: Secondary | ICD-10-CM | POA: Diagnosis not present

## 2018-07-16 DIAGNOSIS — N183 Chronic kidney disease, stage 3 (moderate): Secondary | ICD-10-CM | POA: Diagnosis not present

## 2018-07-18 DIAGNOSIS — E785 Hyperlipidemia, unspecified: Secondary | ICD-10-CM | POA: Diagnosis not present

## 2018-07-18 DIAGNOSIS — Z85038 Personal history of other malignant neoplasm of large intestine: Secondary | ICD-10-CM | POA: Diagnosis not present

## 2018-07-18 DIAGNOSIS — M160 Bilateral primary osteoarthritis of hip: Secondary | ICD-10-CM | POA: Diagnosis not present

## 2018-07-18 DIAGNOSIS — I129 Hypertensive chronic kidney disease with stage 1 through stage 4 chronic kidney disease, or unspecified chronic kidney disease: Secondary | ICD-10-CM | POA: Diagnosis not present

## 2018-07-18 DIAGNOSIS — M47816 Spondylosis without myelopathy or radiculopathy, lumbar region: Secondary | ICD-10-CM | POA: Diagnosis not present

## 2018-07-18 DIAGNOSIS — N183 Chronic kidney disease, stage 3 (moderate): Secondary | ICD-10-CM | POA: Diagnosis not present

## 2018-07-23 DIAGNOSIS — Z85038 Personal history of other malignant neoplasm of large intestine: Secondary | ICD-10-CM | POA: Diagnosis not present

## 2018-07-23 DIAGNOSIS — M160 Bilateral primary osteoarthritis of hip: Secondary | ICD-10-CM | POA: Diagnosis not present

## 2018-07-23 DIAGNOSIS — M47816 Spondylosis without myelopathy or radiculopathy, lumbar region: Secondary | ICD-10-CM | POA: Diagnosis not present

## 2018-07-23 DIAGNOSIS — I129 Hypertensive chronic kidney disease with stage 1 through stage 4 chronic kidney disease, or unspecified chronic kidney disease: Secondary | ICD-10-CM | POA: Diagnosis not present

## 2018-07-23 DIAGNOSIS — E785 Hyperlipidemia, unspecified: Secondary | ICD-10-CM | POA: Diagnosis not present

## 2018-07-23 DIAGNOSIS — N183 Chronic kidney disease, stage 3 (moderate): Secondary | ICD-10-CM | POA: Diagnosis not present

## 2018-07-25 DIAGNOSIS — Z85038 Personal history of other malignant neoplasm of large intestine: Secondary | ICD-10-CM | POA: Diagnosis not present

## 2018-07-25 DIAGNOSIS — M160 Bilateral primary osteoarthritis of hip: Secondary | ICD-10-CM | POA: Diagnosis not present

## 2018-07-25 DIAGNOSIS — N183 Chronic kidney disease, stage 3 (moderate): Secondary | ICD-10-CM | POA: Diagnosis not present

## 2018-07-25 DIAGNOSIS — M47816 Spondylosis without myelopathy or radiculopathy, lumbar region: Secondary | ICD-10-CM | POA: Diagnosis not present

## 2018-07-25 DIAGNOSIS — E785 Hyperlipidemia, unspecified: Secondary | ICD-10-CM | POA: Diagnosis not present

## 2018-07-25 DIAGNOSIS — I129 Hypertensive chronic kidney disease with stage 1 through stage 4 chronic kidney disease, or unspecified chronic kidney disease: Secondary | ICD-10-CM | POA: Diagnosis not present

## 2018-07-30 DIAGNOSIS — N183 Chronic kidney disease, stage 3 (moderate): Secondary | ICD-10-CM | POA: Diagnosis not present

## 2018-07-30 DIAGNOSIS — M160 Bilateral primary osteoarthritis of hip: Secondary | ICD-10-CM | POA: Diagnosis not present

## 2018-07-30 DIAGNOSIS — E785 Hyperlipidemia, unspecified: Secondary | ICD-10-CM | POA: Diagnosis not present

## 2018-07-30 DIAGNOSIS — Z85038 Personal history of other malignant neoplasm of large intestine: Secondary | ICD-10-CM | POA: Diagnosis not present

## 2018-07-30 DIAGNOSIS — M47816 Spondylosis without myelopathy or radiculopathy, lumbar region: Secondary | ICD-10-CM | POA: Diagnosis not present

## 2018-07-30 DIAGNOSIS — I129 Hypertensive chronic kidney disease with stage 1 through stage 4 chronic kidney disease, or unspecified chronic kidney disease: Secondary | ICD-10-CM | POA: Diagnosis not present

## 2018-08-06 DIAGNOSIS — N183 Chronic kidney disease, stage 3 (moderate): Secondary | ICD-10-CM | POA: Diagnosis not present

## 2018-08-06 DIAGNOSIS — I129 Hypertensive chronic kidney disease with stage 1 through stage 4 chronic kidney disease, or unspecified chronic kidney disease: Secondary | ICD-10-CM | POA: Diagnosis not present

## 2018-08-06 DIAGNOSIS — Z85038 Personal history of other malignant neoplasm of large intestine: Secondary | ICD-10-CM | POA: Diagnosis not present

## 2018-08-06 DIAGNOSIS — M160 Bilateral primary osteoarthritis of hip: Secondary | ICD-10-CM | POA: Diagnosis not present

## 2018-08-06 DIAGNOSIS — M47816 Spondylosis without myelopathy or radiculopathy, lumbar region: Secondary | ICD-10-CM | POA: Diagnosis not present

## 2018-08-06 DIAGNOSIS — E785 Hyperlipidemia, unspecified: Secondary | ICD-10-CM | POA: Diagnosis not present

## 2018-09-19 DIAGNOSIS — I129 Hypertensive chronic kidney disease with stage 1 through stage 4 chronic kidney disease, or unspecified chronic kidney disease: Secondary | ICD-10-CM | POA: Diagnosis not present

## 2018-09-19 DIAGNOSIS — R7301 Impaired fasting glucose: Secondary | ICD-10-CM | POA: Diagnosis not present

## 2018-09-19 DIAGNOSIS — E782 Mixed hyperlipidemia: Secondary | ICD-10-CM | POA: Diagnosis not present

## 2018-09-19 DIAGNOSIS — N183 Chronic kidney disease, stage 3 (moderate): Secondary | ICD-10-CM | POA: Diagnosis not present

## 2018-10-22 ENCOUNTER — Other Ambulatory Visit (INDEPENDENT_AMBULATORY_CARE_PROVIDER_SITE_OTHER): Payer: Self-pay | Admitting: Orthopaedic Surgery

## 2018-10-22 NOTE — Telephone Encounter (Signed)
Please advise 

## 2018-12-12 IMAGING — MG DIGITAL DIAGNOSTIC UNILATERAL RIGHT MAMMOGRAM
3 series · 3 of 3 positions shown · non-contrast
Comparison: March 14, 2018 and earlier priors

CLINICAL DATA: 89-year-old patient recalled from screening
mammogram for evaluation of right breast calcifications.

EXAM:
DIGITAL DIAGNOSTIC RIGHT MAMMOGRAM

[R ML (1 of 2)]
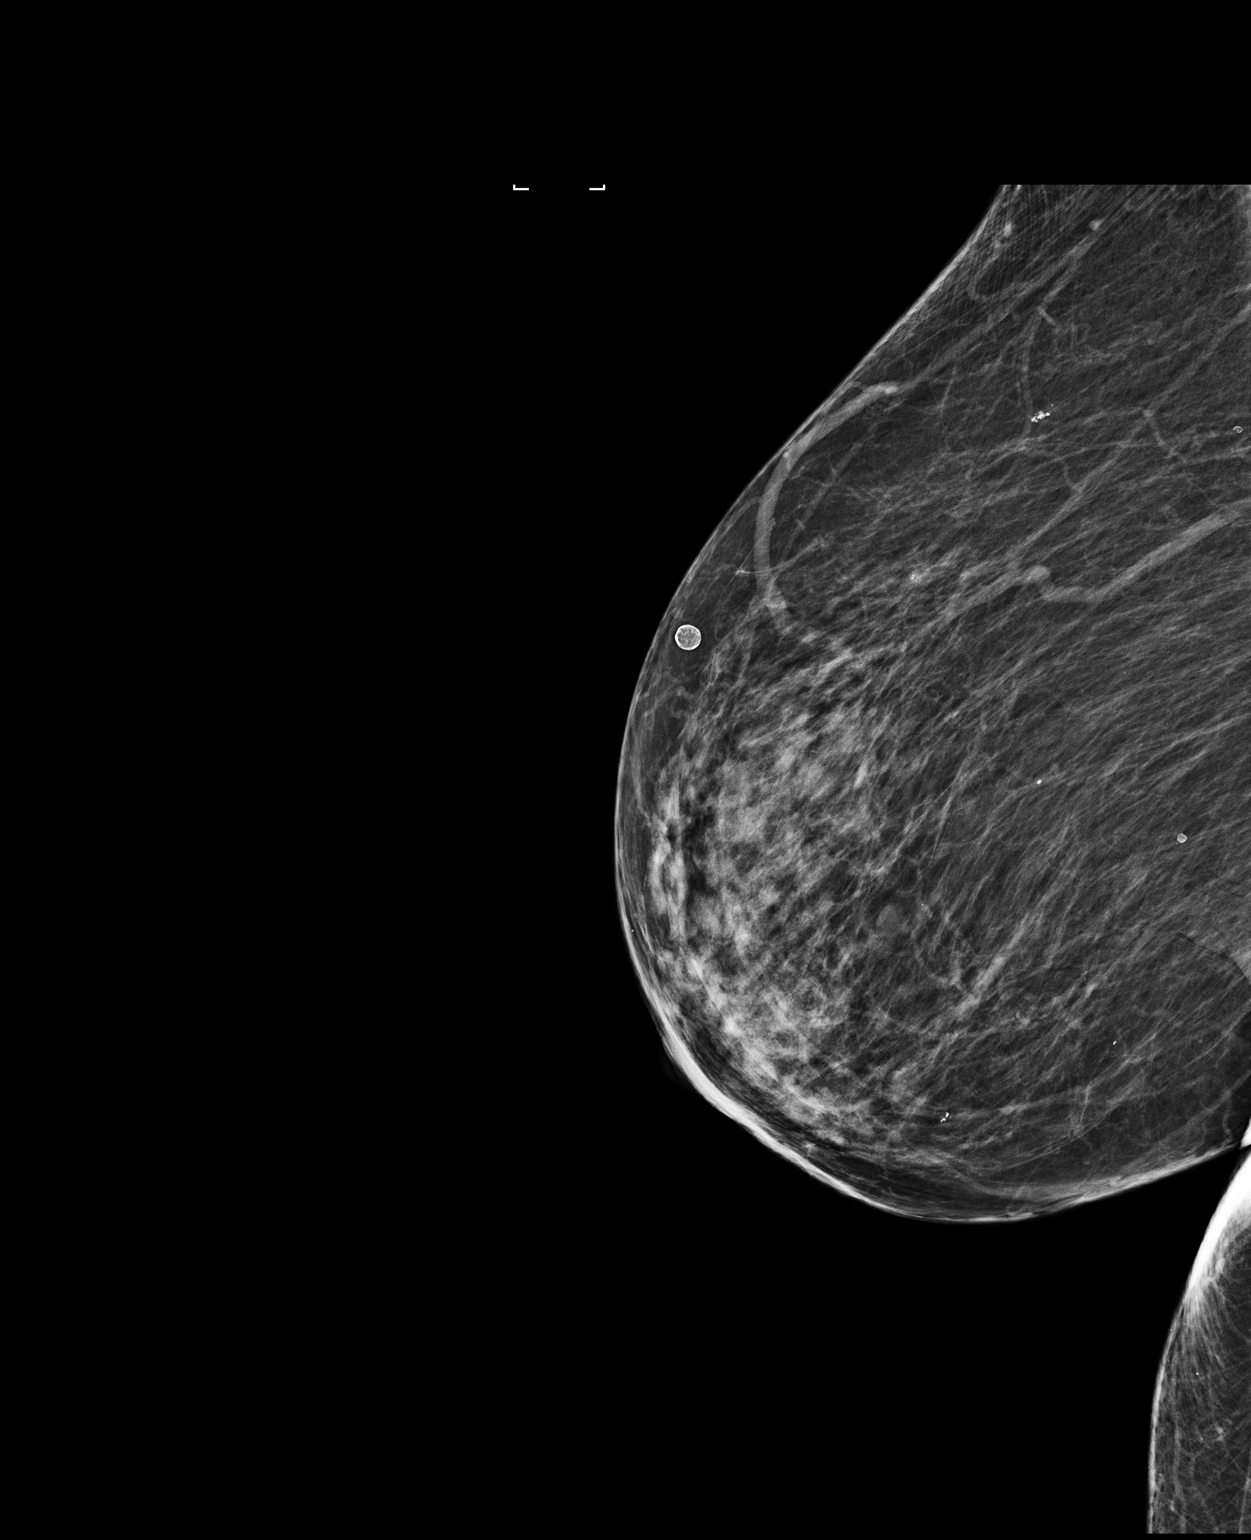

[R CC]
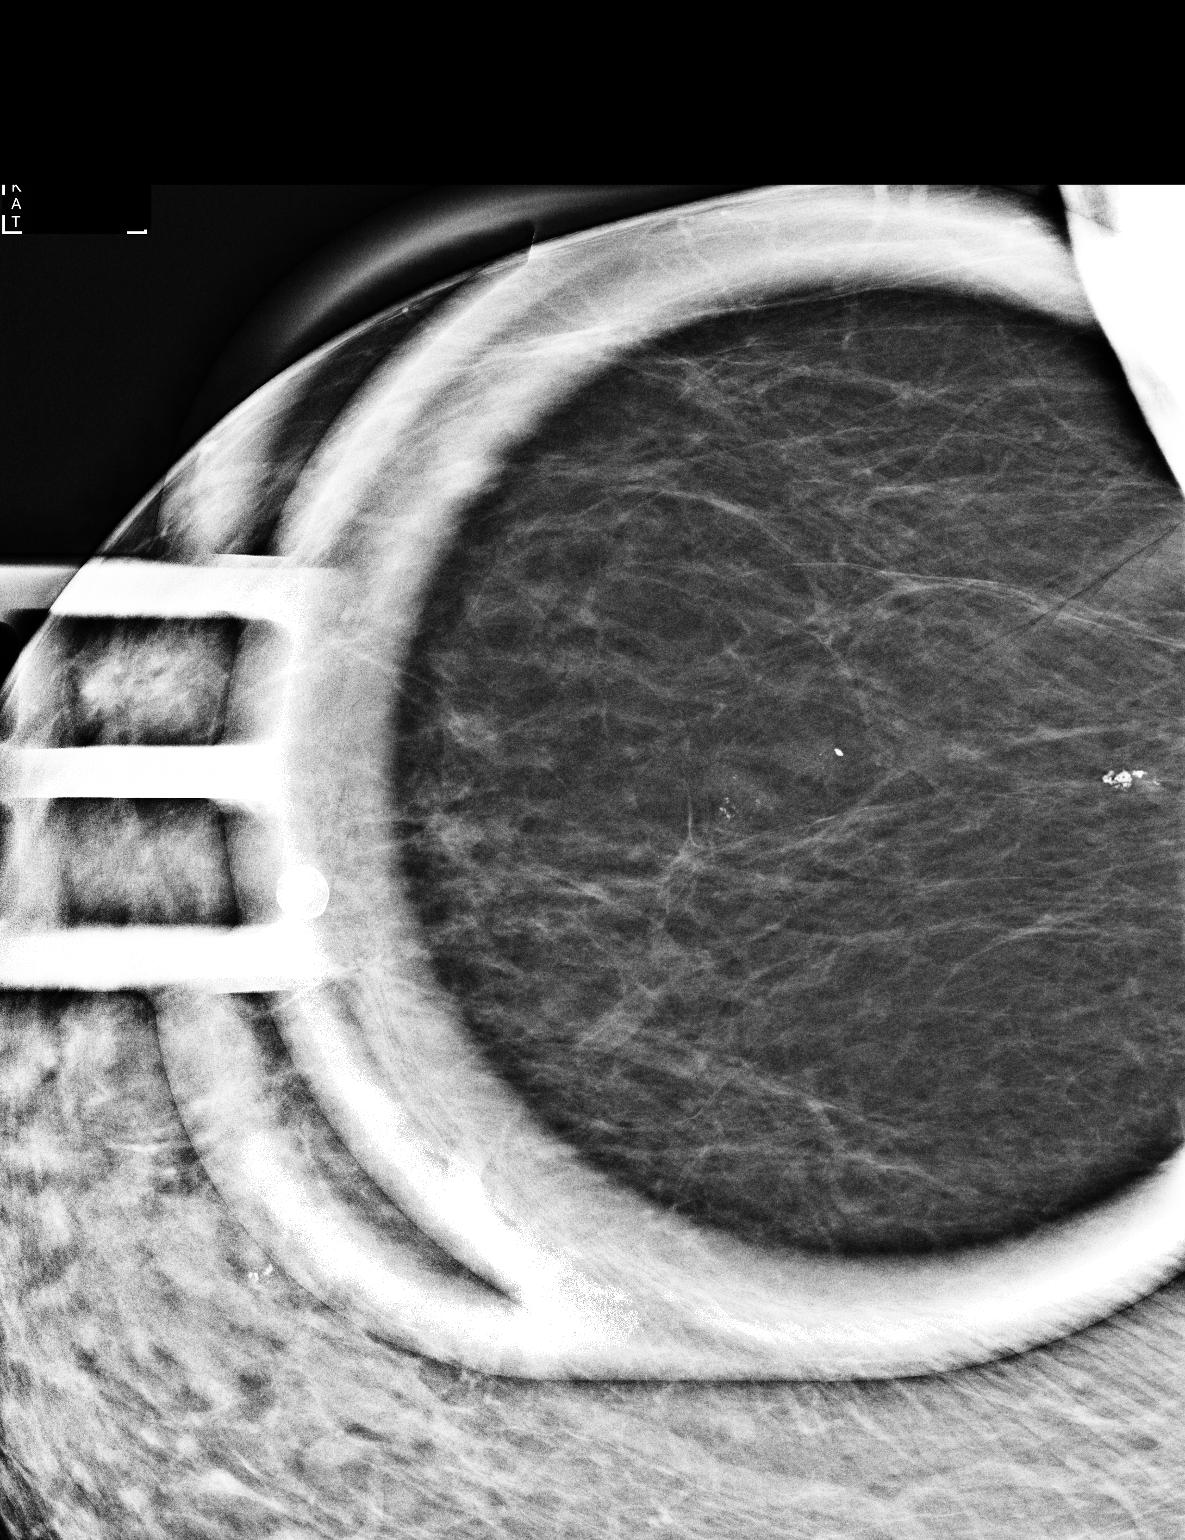

[R ML (2 of 2)]
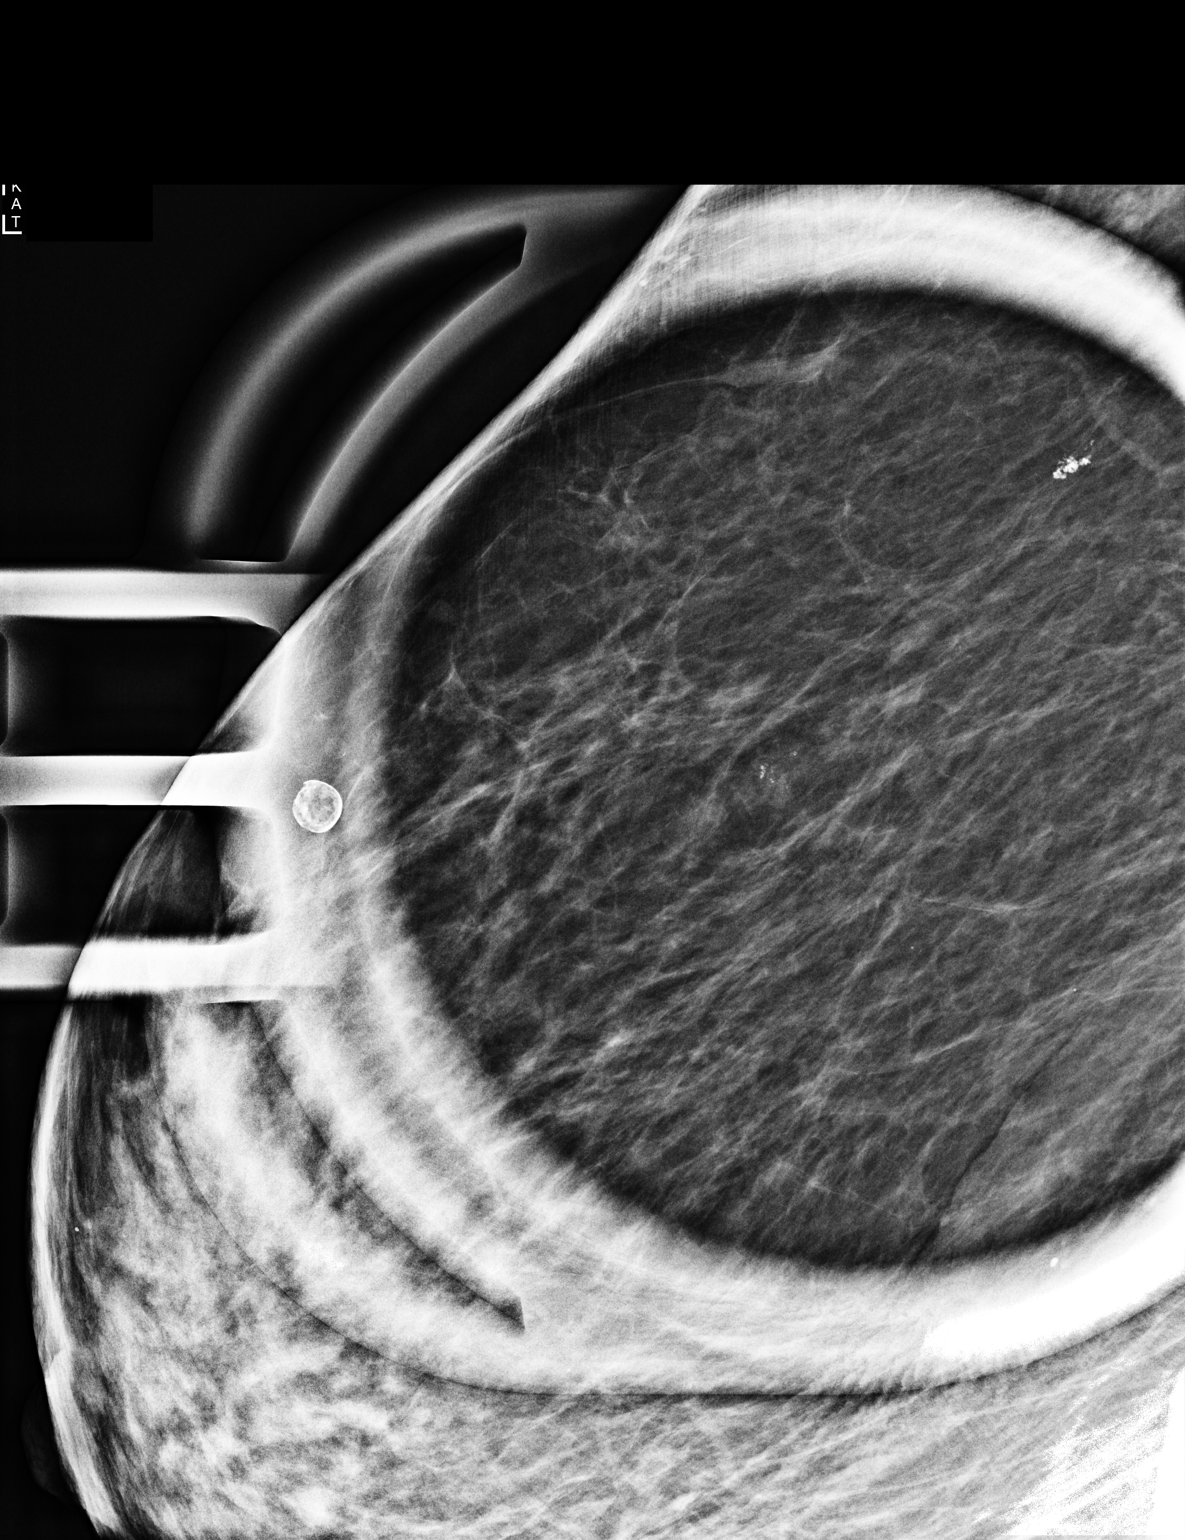

[3 of 3 positions shown; findings below may reference images not displayed]

ACR Breast Density Category b: There are scattered areas of
fibroglandular density.
FINDINGS: Magnification views of the upper outer right breast confirm a group
of heterogeneous microcalcifications spanning 1.5 x 0.6 x 0.4 cm.
There is no associated mass or distortion.
IMPRESSION: 1.5 cm group of heterogeneous calcifications in the upper-outer
quadrant of the right breast. Ductal carcinoma in situ cannot be
excluded.

RECOMMENDATION:
The option of stereotactic biopsy was discussed with the patient. We
discussed the procedure in detail. The patient is also aware that if
she should not desire biopsy, six-month follow-up mammogram is a
consideration.

The patient prefers stereotactic biopsy for diagnosis. This has been
scheduled for [REDACTED], April 04, 2018.

I have discussed the findings and recommendations with the patient.
Results were also provided in writing at the conclusion of the
visit. If applicable, a reminder letter will be sent to the patient
regarding the next appointment.

BI-RADS CATEGORY  4: Suspicious.

## 2019-04-08 DIAGNOSIS — Z23 Encounter for immunization: Secondary | ICD-10-CM | POA: Diagnosis not present

## 2019-04-08 DIAGNOSIS — E782 Mixed hyperlipidemia: Secondary | ICD-10-CM | POA: Diagnosis not present

## 2019-04-08 DIAGNOSIS — R7301 Impaired fasting glucose: Secondary | ICD-10-CM | POA: Diagnosis not present

## 2019-04-08 DIAGNOSIS — N183 Chronic kidney disease, stage 3 unspecified: Secondary | ICD-10-CM | POA: Diagnosis not present

## 2019-04-08 DIAGNOSIS — I129 Hypertensive chronic kidney disease with stage 1 through stage 4 chronic kidney disease, or unspecified chronic kidney disease: Secondary | ICD-10-CM | POA: Diagnosis not present

## 2019-04-10 DIAGNOSIS — Z7189 Other specified counseling: Secondary | ICD-10-CM | POA: Diagnosis not present

## 2019-04-10 DIAGNOSIS — R4189 Other symptoms and signs involving cognitive functions and awareness: Secondary | ICD-10-CM | POA: Diagnosis not present

## 2019-04-10 DIAGNOSIS — Z85038 Personal history of other malignant neoplasm of large intestine: Secondary | ICD-10-CM | POA: Diagnosis not present

## 2019-04-10 DIAGNOSIS — R7301 Impaired fasting glucose: Secondary | ICD-10-CM | POA: Diagnosis not present

## 2019-04-10 DIAGNOSIS — E782 Mixed hyperlipidemia: Secondary | ICD-10-CM | POA: Diagnosis not present

## 2019-04-10 DIAGNOSIS — M899 Disorder of bone, unspecified: Secondary | ICD-10-CM | POA: Diagnosis not present

## 2019-04-10 DIAGNOSIS — M1990 Unspecified osteoarthritis, unspecified site: Secondary | ICD-10-CM | POA: Diagnosis not present

## 2019-04-10 DIAGNOSIS — I129 Hypertensive chronic kidney disease with stage 1 through stage 4 chronic kidney disease, or unspecified chronic kidney disease: Secondary | ICD-10-CM | POA: Diagnosis not present

## 2019-04-10 DIAGNOSIS — N183 Chronic kidney disease, stage 3 unspecified: Secondary | ICD-10-CM | POA: Diagnosis not present

## 2019-04-10 DIAGNOSIS — Z0000 Encounter for general adult medical examination without abnormal findings: Secondary | ICD-10-CM | POA: Diagnosis not present

## 2019-10-09 ENCOUNTER — Other Ambulatory Visit: Payer: Self-pay | Admitting: Family Medicine

## 2019-10-09 DIAGNOSIS — R29810 Facial weakness: Secondary | ICD-10-CM

## 2019-10-10 ENCOUNTER — Other Ambulatory Visit: Payer: Medicare Other

## 2020-01-06 DIAGNOSIS — R5383 Other fatigue: Secondary | ICD-10-CM | POA: Diagnosis not present

## 2020-03-02 ENCOUNTER — Telehealth: Payer: Self-pay | Admitting: Hospice

## 2020-03-02 NOTE — Telephone Encounter (Signed)
Spoke with patient's son, [name], regarding Palliative services and he was in agreement with scheduling visit.  I have scheduled an In-person Consult for 03/04/20 @ 8:30 AM

## 2020-03-04 ENCOUNTER — Other Ambulatory Visit: Payer: Medicare Other | Admitting: Hospice

## 2020-03-04 ENCOUNTER — Other Ambulatory Visit: Payer: Self-pay

## 2020-03-04 DIAGNOSIS — Z515 Encounter for palliative care: Secondary | ICD-10-CM

## 2020-03-04 DIAGNOSIS — F0390 Unspecified dementia without behavioral disturbance: Secondary | ICD-10-CM

## 2020-03-04 NOTE — Progress Notes (Signed)
Cheryl Wilcox Consult Note Telephone: [phone]  Fax: [phone]  PATIENT NAME: Cheryl Wilcox [address] [place] [address] [phone] (home)  DOB: 01-Jul-1927 MRN: [id number]  PRIMARY CARE PROVIDER:    Mayra Neer, MD,  [place] [address] [place] [address] [phone]  REFERRING PROVIDER:   Mayra Neer, MD [address] [place],  [place] [address] [phone]  RESPONSIBLE PARTYJerene Canny Wilcox [place] daytime.  Extended Emergency Contact Information Primary Emergency Contact: Cheryl Wilcox Phone: [phone] Relation: Son    I met face to face with patient and family in home/facility.  RECOMMENDATIONS/PLAN:    Visit at the request of  Dr. Mayra Wilcox for palliative consult. Patient was assessed for Hospice services on 01/31/20 and the family refused hospice services but are in agreement with beginning palliative care services and transitioning to Hospice when they are ready. Visit consisted of building trust and discussions on Palliative Medicine as specialized medical care for people living with serious illness, aimed at facilitating better quality of life through symptoms relief, assisting with advance care plan and establishing goals of care.   Discussion on the difference between Palliative and Hospice care. Palliative care and hospice have similar goals of managing symptoms, promoting comfort, improving quality of life, and maintaining a person's dignity. However, palliative care may be offered during any phase of a serious illness, while hospice care is usually offered when a person is expected to live for 6 months or less.  Advance Care Planning: Our advance care planning conversation included a discussion about:    The value and importance of advance care planning  Experiences  with loved ones who have been seriously ill or have died  Exploration of personal, cultural or spiritual beliefs that might influence medical decisions  Exploration of goals of care in the event of a sudden injury or illness  Identification and preparation of a healthcare agent  Review and updating or creation of an  advance directive document  CODE STATUS: Discussion on implications and ramifications of code status. Patient/[name] elected DO NOT RESUSCITATE. NP signed DNR form for patient; same document uploaded to Epic today.   GOALS OF CARE: Goals of care include to maximize quality of life and symptom management. Patient and family are interested in Hospice when she qualifies for it; she will like her last days to be in her home, not seperated from her daughter Cheryl Wilcox.  Therapeutic listening and ample emotional support  Provided. Palliative care will continue to provide support to patient, family and the medical team.  Follow up Palliative Care Visit: Palliative care will continue to follow for goals of care clarification and symptom management. Patient requested visit in a month.   Symptom Management: Patient with history of recurring UTI, last treated with antibiotics last month per [facility]; no urinary symptoms today.   Memory loss/confusion related to hx of  Dementia is ongoing. Patient is able to communicate her simple needs. Patient is incontinent of urine, continent of bowel.  FLACC 0 FAST 6c. Education on Dementia disease trajectory: progressive/terminal.   Appetite is fair, patient has lost weight, current Height 5 feet 5 inches, weight 118 Ibs down 134 Ib 6 months ago. Little small meals 4-5 times a day and snacks in between recommended.  She is ambulatory with her rolling walker. No recent fall  reported. Fall precautions discussed.   Constipation: Patient on Miralax; encouraged Miralax and adequate hydration.  Encouraged food with fibre like fruits and vegetables.  Palliative will continue  to monitor for symptom management/decline and make recommendations as needed.   Family /Caregiver/Community Supports:  Pateint lives at home with daughter Cheryl Wilcox and has private caregivers 24/7. Cheryl Wilcox has cerebral palsy.  Cheryl Wilcox is the [place],  lives in [place]; quite involved in patient's care; he coordinates patient's care. Family's [employer] affliation provides spiritual strength, fellowship and anchor.   I spent  One hour and 46 minutes providing this initial consultation; time includes time spent with patient/family, chart review, provider coordination,  and documentation. More than 50% of the time in this consultation was spent on coordinating communication  CHIEF COMPLAIN/HISTORY OF PRESENT ILLNESS:  Cheryl Wilcox is a 86 y.o. female with multiple medical problems including Hypertensive heart disease with CKD 3, Dementia history of colon CA. Palliative Care was asked to follow this patient by consultation request of Cheryl Neer, MD to help address advance care planning and goals of care.  CODE STATUS: DNR  PPS: 50%  HOSPICE ELIGIBILITY/DIAGNOSIS: TBD  PAST MEDICAL HISTORY:  Past Medical History:  Diagnosis Date  . Arthritis    OSTEO  . Cancer ([place])    COLON 2002  . Chronic kidney disease    CKD STAGE III  . Hyperlipidemia   . Hypertension     SOCIAL HX:  Social History   Tobacco Use  . Smoking status: Never Smoker  . Smokeless tobacco: Never Used  Substance Use Topics  . Alcohol use: No   FAMILY HX: No family history on file.  ALLERGIES: No Known Allergies   PERTINENT MEDICATIONS:  Outpatient Encounter Medications as of 03/04/2020  Medication Sig  . acetaminophen (TYLENOL) 500 MG tablet Take 1,000 mg by mouth every 6 (six) hours as needed.  Marland Kitchen amLODipine (NORVASC) 5 MG tablet Take 5 mg by mouth daily.  Marland Kitchen aspirin EC 81 MG tablet Take 81 mg by mouth daily.  Marland Kitchen atorvastatin (LIPITOR) 10 MG tablet Take 10 mg by mouth daily.  . Calcium Carbonate (CALCIUM 600 PO)  Take 1 tablet by mouth 2 (two) times daily with a meal.  . Cholecalciferol (VITAMIN D3 PO) Take 2,000 Units by mouth 2 (two) times daily.  Marland Kitchen ibandronate (BONIVA) 150 MG tablet Take 150 mg by mouth every 30 (thirty) days. Take in the morning with a full glass of water, on an empty stomach, and do not take anything else by mouth or lie down for the next 30 min.  . meloxicam (MOBIC) 15 MG tablet   . nitrofurantoin, macrocrystal-monohydrate, (MACROBID) 100 MG capsule TAKE 1 CAPSULE BY MOUTH WITH FOOD TWICE A DAY FOR 7 DAY(S)  . sulfamethoxazole-trimethoprim (BACTRIM DS,SEPTRA DS) 800-160 MG tablet TAKE 1 TABLET BY MOUTH TWICE A DAY FOR 7 DAYS  . traMADol (ULTRAM) 50 MG tablet TAKE 1 TABLET EVERY 12 HOURS AS NEEDED FOR PAIN  . triamterene-hydrochlorothiazide (DYAZIDE) 37.5-25 MG capsule Take 1 capsule by mouth daily.   No facility-administered encounter medications on file as of 03/04/2020.    PHYSICAL EXAM / ROS: Height 5 feet 5 inches, weight 118 Ibs down 134 Ib six months ago. General: In no acute distress, cooperative Cardiovascular: regular rate and rhythm, no chest pain reported Pulmonary: no cough, no shortness of breath, clear ant/post fields, normal respiratory effort on room air; no adventitious lung sounds auscultated Abdomen: soft, non tender, positive bowel sounds in all quadrants; no  complain of constipation GU: denies dysuria, no suprapubic tenderness Extremities: no edema, no joint deformities Skin: no rashes to exposed skin Neurological: Weakness but otherwise non focal, forgetful, confused  Teodoro Spray, NP

## 2020-04-08 ENCOUNTER — Other Ambulatory Visit: Payer: Self-pay

## 2020-04-08 ENCOUNTER — Other Ambulatory Visit: Payer: Medicare Other | Admitting: Hospice

## 2020-04-08 DIAGNOSIS — F0390 Unspecified dementia without behavioral disturbance: Secondary | ICD-10-CM

## 2020-04-08 DIAGNOSIS — Z515 Encounter for palliative care: Secondary | ICD-10-CM

## 2020-04-08 NOTE — Progress Notes (Signed)
[facility] Consult Note Telephone: [phone]  Fax: [phone]  PATIENT NAME: Cheryl Wilcox DOB: 11-07-1927 MRN: [id number]  PRIMARY CARE PROVIDER:   Mayra Neer, MD Cheryl Neer, MD [place] [place],  [place] [address]  REFERRING PROVIDER: Mayra Neer, MD Cheryl Neer, MD [place] [address] [place],  [place] [address]  RESPONSIBLE PARTYJerene Canny Wilcox [place] daytime. Cheryl Wilcox - caregiver from 2.30p - 7.30pm [phone]  Extended Emergency Contact Information Primary Emergency Contact: [place] of [place] Phone: [phone] Relation: Son    I met face to face with patient and family in home/facility.  RECOMMENDATIONS/PLAN:   Visit is to build trust and follow-up on palliative care, facilitating better quality of life through symptoms relief, assisting with advance care plan and establishing goals of care.   Advance Care Planning/CODE STATUS:  Patient is a DO NOT RESUSCITATE.   GOALS OF CARE: Goals of care include to maximize quality of life and symptom management.   Further discussions today with Cheryl Wilcox on goals of care clarification.  NP left a copy of the medical orders for scope of treatment for him, preparatory for next visit discussion. Patient/family open to hospice service when appropriate.  Patient does not want to be separated from her daughter Cheryl Wilcox like hospice services in her home.   Follow up Palliative Care Visit: Palliative care will continue to follow for goals of care clarification and symptom management.  Follow-up in a month/as needed  Symptom Management: Memory loss/confusion related to hx of  Dementia is ongoing. Patient is able to communicate her simple needs. Patient is incontinent of urine, continent of bowel.  FLACC 0 FAST 6c. Education on Dementia disease trajectory:  progressive/terminal.   Encouraged to cueing to ensure adequate oral intake. Appetite is fair, patient has lost weight, current Height 5 feet 5 inches, weight 118 Ibs down 134 Ib 6 months ago. Little small meals 4-5 times a day and snacks in between recommended.  She is ambulatory with her rolling walker; no recent fall reported. Fall precautions discussed.   Constipation: Patient on Miralax-effective.  Encouraged Miralax and adequate hydration.  Encouraged food with fibre like fruits and vegetables.  Palliative will continue to monitor for symptom management/decline and make recommendations as needed.   Family /Caregiver/Community Supports:  Pateint lives at home with daughter Cheryl Wilcox and has private caregivers 24/7. Cheryl Wilcox has cerebral palsy.  Cheryl Wilcox is the [place],  lives in [place]; quite involved in patient's care; he coordinates patient's care. Family's [employer] affliation provides spiritual strength, fellowship and anchor.   I spent   1 hour and 16 minutes providing this initial consultation; time includes time spent with patient/family, chart review, provider coordination,  and documentation. More than 50% of the time in this consultation was spent on coordinating communication  CHIEF COMPLAIN/HISTORY OF PRESENT ILLNESS:  Cheryl Wilcox is a 86 y.o. female with multiple medical problems including Hypertensive heart disease with CKD 3, Dementia history of colon CA. Palliative Care was asked to follow this patient by consultation request of Cheryl Neer, MD to help address advance care planning and goals of care.  CODE STATUS: DNR  PPS: 50%  HOSPICE ELIGIBILITY/DIAGNOSIS: TBD  PAST MEDICAL HISTORY:  Past Medical History:  Diagnosis Date  . Arthritis    OSTEO  . Cancer ([place])    COLON 2002  .  Chronic kidney disease    CKD STAGE III  . Hyperlipidemia   . Hypertension     SOCIAL HX:  Social History   Tobacco Use  . Smoking status: Never Smoker  . Smokeless tobacco:  Never Used  Substance Use Topics  . Alcohol use: No    ALLERGIES: No Known Allergies   PERTINENT MEDICATIONS:  Outpatient Encounter Medications as of 04/08/2020  Medication Sig  . acetaminophen (TYLENOL) 500 MG tablet Take 1,000 mg by mouth every 6 (six) hours as needed.  Marland Kitchen amLODipine (NORVASC) 5 MG tablet Take 5 mg by mouth daily.  Marland Kitchen aspirin EC 81 MG tablet Take 81 mg by mouth daily.  Marland Kitchen atorvastatin (LIPITOR) 10 MG tablet Take 10 mg by mouth daily.  . Calcium Carbonate (CALCIUM 600 PO) Take 1 tablet by mouth 2 (two) times daily with a meal.  . Cholecalciferol (VITAMIN D3 PO) Take 2,000 Units by mouth 2 (two) times daily.  Marland Kitchen ibandronate (BONIVA) 150 MG tablet Take 150 mg by mouth every 30 (thirty) days. Take in the morning with a full glass of water, on an empty stomach, and do not take anything else by mouth or lie down for the next 30 min.  . meloxicam (MOBIC) 15 MG tablet   . nitrofurantoin, macrocrystal-monohydrate, (MACROBID) 100 MG capsule TAKE 1 CAPSULE BY MOUTH WITH FOOD TWICE A DAY FOR 7 DAY(S)  . sulfamethoxazole-trimethoprim (BACTRIM DS,SEPTRA DS) 800-160 MG tablet TAKE 1 TABLET BY MOUTH TWICE A DAY FOR 7 DAYS  . traMADol (ULTRAM) 50 MG tablet TAKE 1 TABLET EVERY 12 HOURS AS NEEDED FOR PAIN  . triamterene-hydrochlorothiazide (DYAZIDE) 37.5-25 MG capsule Take 1 capsule by mouth daily.   No facility-administered encounter medications on file as of 04/08/2020.    PHYSICAL EXAM/ROS:  General: NAD, frail appearing, cooperative Cardiovascular: regular rate and rhythm; denies chest pain Pulmonary: clear ant//post fields; normal respiratory effort; no adventitious lung sounds auscultated Abdomen: soft, nontender, + bowel sounds; no guarding GU: no suprapubic tenderness, no urinary symptoms Extremities: no edema, no joint deformities Skin: Dry skin; no rashes to exposed skin Neurological: Weakness but otherwise nonfocal, forgetful  Teodoro Spray, NP

## 2020-05-03 ENCOUNTER — Telehealth: Payer: Self-pay | Admitting: Hospice

## 2020-05-03 NOTE — Telephone Encounter (Signed)
[name] called asking how patient can receive services from podiatrist, and also if patient can receive Covid/flu vaccines at home.  NP gave him contact information for [facility] that does in-home vaccine shots.  He was advised that a referral from PCP may be needed for podiatrist service.  He said he will call PCP today for referral.

## 2020-05-12 ENCOUNTER — Encounter: Payer: Self-pay | Admitting: Podiatry

## 2020-05-12 ENCOUNTER — Other Ambulatory Visit: Payer: Self-pay

## 2020-05-12 ENCOUNTER — Ambulatory Visit (INDEPENDENT_AMBULATORY_CARE_PROVIDER_SITE_OTHER): Payer: Medicare Other | Admitting: Podiatry

## 2020-05-12 ENCOUNTER — Ambulatory Visit: Payer: BLUE CROSS/BLUE SHIELD | Admitting: Podiatry

## 2020-05-12 DIAGNOSIS — B351 Tinea unguium: Secondary | ICD-10-CM

## 2020-05-12 DIAGNOSIS — M79675 Pain in left toe(s): Secondary | ICD-10-CM

## 2020-05-12 DIAGNOSIS — M79674 Pain in right toe(s): Secondary | ICD-10-CM

## 2020-05-12 NOTE — Progress Notes (Signed)
Subjective:   Patient ID: Cheryl Wilcox, female   DOB: 86 y.o.   MRN: [id number]   HPI Patient presents with thick yellow brittle nailbeds 1-5 both feet that she cannot cut and are painful   ROS      Objective:  Physical Exam  Neurovascular status unchanged with severely thickened elongated yellow brittle nailbeds 1-5 both feet painful     Assessment:  Chronic mycotic nail infection 1-5 both feet     Plan:  Debridement nailbeds 1-5 both feet no iatrogenic bleeding reappoint as needed signed visit

## 2020-06-09 ENCOUNTER — Other Ambulatory Visit: Payer: Medicare Other | Admitting: Hospice

## 2020-06-09 ENCOUNTER — Other Ambulatory Visit: Payer: Self-pay

## 2020-06-09 DIAGNOSIS — Z515 Encounter for palliative care: Secondary | ICD-10-CM | POA: Diagnosis not present

## 2020-06-09 DIAGNOSIS — F0390 Unspecified dementia without behavioral disturbance: Secondary | ICD-10-CM

## 2020-06-09 NOTE — Progress Notes (Signed)
[facility] Consult Note Telephone: [phone]  Fax: [phone]  PATIENT NAME: Cheryl Wilcox DOB: 1928-05-27 MRN: [id number]  PRIMARY CARE PROVIDER:   Mayra Neer, MD Mayra Neer, MD [address] [place],  [place] [address]  REFERRING PROVIDER: Mayra Neer, MD Mayra Neer, MD [place] [address] [place],  [place] [address]  RESPONSIBLE PARTY:Son - [name] Audino [phone] Daughter- Gale Norma- Caregiver [phone] Mon - Fri daytime. Letta Median - caregiver from 2.30p - 7.30pm [phone] Extended Emergency Contact Information Primary Emergency Contact: Cheryl Wilcox of [place] Phone: [phone] Relation: Son   I met face to face with patient and family in home/facility.  RECOMMENDATIONS/PLAN:  Visit is to build trust and follow-up on palliative care, facilitating better quality of life through symptoms relief, assisting with advance care plan and establishing goals of care.  Meeting with patient, her son Cheryl Wilcox, and caregiver Letta Median.  Advance Care Planning/CODE STATUS: Patient is a DO NOT RESUSCITATE.  GOALS OF CARE: Goals of care include to maximize quality of life and symptom management.   Extensive discussions today on medical orders for scope of treatment.  Most selections include do not attempt resuscitation, limited additional intervention, IV fluids if indicated, antibiotics if indicated, no feeding tube. Patient/family open to hospice service when appropriate.  Patient does not want to be separated from her daughter Cheryl Wilcox; like hospice services in her home.  Follow up Palliative Care Visit: Palliative care will continue to follow for goals of care clarification and symptom management.  Follow-up in a month/as needed  Symptom Management: Memory loss/confusion related to hx of Dementia is ongoing/baseline. Patient is incontinent of urine,continent of  bowel.FLACC 0 FAST 6c. Education on Dementia disease trajectory: progressive/terminal.  Encouraged to cueing to ensure adequate oral intake. Dry skin: Apply moisturizer to skin twice daily/as needed Appetite has improved.  Continue Ensure and offer little small meals 4-5 times a day and snacks in between.  Patient in no respiratory distress, denies pain/discomfort.  No report of fall or hospitalization since last visit.  [name] and caregiver with no concerns at this time.  Palliative will continue to monitor for symptom management/decline and make recommendations as needed.  Family /Caregiver/Community Supports:Pateint lives at home with daughter Cheryl Wilcox and has private caregivers 24/7. Cheryl Wilcox has cerebral palsy.Cheryl Wilcox is the [facility] in [place]; quite involved in patient's care; he coordinates patient's care. Family's [employer] affliation provides spiritual strength, fellowship and anchor.  I spent55 minutes providing this consultation; time includes time spent with patient/family, chart review, provider coordination, and documentation. More than 50% of the time in this consultation was spent on coordinating communication  CHIEF COMPLAIN/HISTORY OF PRESENT ILLNESS:Cheryl R Pardueis a 87 y.o.femalewith multiple medical problems including Hypertensive heart disease with CKD 3, Dementia history of colon CA. Palliative Care was asked to follow this patient by consultation request ofShaw, Kimberlee, MDto help address advance care planning and goals of care.  CODE STATUS:DNR  PPS:50%   HOSPICE ELIGIBILITY/DIAGNOSIS: TBD  PAST MEDICAL HISTORY:  Past Medical History:  Diagnosis Date  . Arthritis    OSTEO  . Cancer ([place])    COLON 2002  . Chronic kidney disease    CKD STAGE III  . Hyperlipidemia   . Hypertension     SOCIAL HX:  Social History   Tobacco Use  . Smoking status: Never Smoker  . Smokeless tobacco: Never Used  Substance Use Topics  . Alcohol use:  No     ALLERGIES: No Known Allergies   PERTINENT MEDICATIONS:  Outpatient Encounter Medications as of 06/09/2020  Medication Sig  . acetaminophen (TYLENOL) 500 MG tablet Take 1,000 mg by mouth every 6 (six) hours as needed.  Marland Kitchen amLODipine (NORVASC) 5 MG tablet Take 5 mg by mouth daily.  Marland Kitchen aspirin EC 81 MG tablet Take 81 mg by mouth daily.  Marland Kitchen atorvastatin (LIPITOR) 10 MG tablet Take 10 mg by mouth daily.  . Calcium Carbonate (CALCIUM 600 PO) Take 1 tablet by mouth 2 (two) times daily with a meal.  . Cholecalciferol (VITAMIN D3 PO) Take 2,000 Units by mouth 2 (two) times daily.  Marland Kitchen ibandronate (BONIVA) 150 MG tablet Take 150 mg by mouth every 30 (thirty) days. Take in the morning with a full glass of water, on an empty stomach, and do not take anything else by mouth or lie down for the next 30 min.  . meloxicam (MOBIC) 15 MG tablet   . nitrofurantoin, macrocrystal-monohydrate, (MACROBID) 100 MG capsule TAKE 1 CAPSULE BY MOUTH WITH FOOD TWICE A DAY FOR 7 DAY(S)  . sulfamethoxazole-trimethoprim (BACTRIM DS,SEPTRA DS) 800-160 MG tablet TAKE 1 TABLET BY MOUTH TWICE A DAY FOR 7 DAYS  . traMADol (ULTRAM) 50 MG tablet TAKE 1 TABLET EVERY 12 HOURS AS NEEDED FOR PAIN  . triamterene-hydrochlorothiazide (DYAZIDE) 37.5-25 MG capsule Take 1 capsule by mouth daily.   No facility-administered encounter medications on file as of 06/09/2020.    PHYSICAL EXAM/ROS Weight:134 Ibs    Height 5 feet 2.5 inches General: NAD, cooperative Cardiovascular: regular rate and rhythm Pulmonary: clear ant fields Abdomen: soft, nontender, + bowel sounds GU: no suprapubic tenderness Extremities: no edema, no joint deformities Skin: dry fragile skin that tears easily Neurological: Weakness but otherwise nonfocal  Note:  Portions of this note were generated with Dragon dictation software. Dictation errors may occur despite attempts at proofreading.  Teodoro Spray, NP

## 2020-08-20 ENCOUNTER — Other Ambulatory Visit: Payer: Self-pay

## 2020-08-20 ENCOUNTER — Ambulatory Visit (INDEPENDENT_AMBULATORY_CARE_PROVIDER_SITE_OTHER): Payer: BLUE CROSS/BLUE SHIELD | Admitting: Podiatry

## 2020-08-20 ENCOUNTER — Encounter: Payer: Self-pay | Admitting: Podiatry

## 2020-08-20 DIAGNOSIS — I129 Hypertensive chronic kidney disease with stage 1 through stage 4 chronic kidney disease, or unspecified chronic kidney disease: Secondary | ICD-10-CM | POA: Insufficient documentation

## 2020-08-20 DIAGNOSIS — M79674 Pain in right toe(s): Secondary | ICD-10-CM

## 2020-08-20 DIAGNOSIS — M8580 Other specified disorders of bone density and structure, unspecified site: Secondary | ICD-10-CM | POA: Insufficient documentation

## 2020-08-20 DIAGNOSIS — G510 Bell's palsy: Secondary | ICD-10-CM | POA: Insufficient documentation

## 2020-08-20 DIAGNOSIS — R4189 Other symptoms and signs involving cognitive functions and awareness: Secondary | ICD-10-CM | POA: Insufficient documentation

## 2020-08-20 DIAGNOSIS — Z85038 Personal history of other malignant neoplasm of large intestine: Secondary | ICD-10-CM | POA: Insufficient documentation

## 2020-08-20 DIAGNOSIS — B351 Tinea unguium: Secondary | ICD-10-CM

## 2020-08-20 DIAGNOSIS — E876 Hypokalemia: Secondary | ICD-10-CM | POA: Insufficient documentation

## 2020-08-20 DIAGNOSIS — R7301 Impaired fasting glucose: Secondary | ICD-10-CM | POA: Insufficient documentation

## 2020-08-20 DIAGNOSIS — M79675 Pain in left toe(s): Secondary | ICD-10-CM

## 2020-08-20 DIAGNOSIS — L98499 Non-pressure chronic ulcer of skin of other sites with unspecified severity: Secondary | ICD-10-CM | POA: Insufficient documentation

## 2020-08-20 DIAGNOSIS — K8020 Calculus of gallbladder without cholecystitis without obstruction: Secondary | ICD-10-CM | POA: Insufficient documentation

## 2020-08-20 DIAGNOSIS — R270 Ataxia, unspecified: Secondary | ICD-10-CM | POA: Insufficient documentation

## 2020-08-20 DIAGNOSIS — K760 Fatty (change of) liver, not elsewhere classified: Secondary | ICD-10-CM | POA: Insufficient documentation

## 2020-08-20 DIAGNOSIS — N390 Urinary tract infection, site not specified: Secondary | ICD-10-CM | POA: Insufficient documentation

## 2020-08-20 NOTE — Progress Notes (Signed)
This patient returns to my office for at risk foot care.  This patient requires this care by a professional since this patient will be at risk due to having chronic kidney disease.  This patient is unable to cut nails herself since the patient cannot reach her nails.These nails are painful walking and wearing shoes.  She presents to the office with her son.  This patient presents for at risk foot care today.  General Appearance  Alert, conversant and in no acute stress.  Vascular  Dorsalis pedis and posterior tibial  pulses are weakly  palpable  bilaterally.  Capillary return is within normal limits  bilaterally. Temperature is within normal limits  bilaterally.  Neurologic  Senn-Weinstein monofilament wire test within normal limits  bilaterally. Muscle power within normal limits bilaterally.  Nails Thick disfigured discolored nails with subungual debris  from hallux to fifth toes bilaterally. No evidence of bacterial infection or drainage bilaterally.  Orthopedic  No limitations of motion  feet .  No crepitus or effusions noted.  No bony pathology or digital deformities noted.  Skin  normotropic skin with no porokeratosis noted bilaterally.  No signs of infections or ulcers noted.     Onychomycosis  Pain in right toes  Pain in left toes  Consent was obtained for treatment procedures.   Mechanical debridement of nails 1-5  bilaterally performed with a nail nipper.  Filed with dremel without incident.    Return office visit  3 months                 Told patient to return for periodic foot care and evaluation due to potential at risk complications.   Gardiner Barefoot DPM

## 2020-08-23 ENCOUNTER — Other Ambulatory Visit: Payer: Medicare Other | Admitting: Hospice

## 2020-08-23 ENCOUNTER — Other Ambulatory Visit: Payer: Self-pay

## 2020-08-23 DIAGNOSIS — F0390 Unspecified dementia without behavioral disturbance: Secondary | ICD-10-CM

## 2020-08-23 DIAGNOSIS — Z515 Encounter for palliative care: Secondary | ICD-10-CM

## 2020-08-23 NOTE — Progress Notes (Signed)
[facility] Consult Note Telephone: [phone]  Fax: [phone]  PATIENT NAME: Cheryl Wilcox DOB: 06-Dec-1927 MRN: [id number]  PRIMARY CARE PROVIDER:   Mayra Neer, MD Mayra Neer, MD [place] [place],  [place] [address]  REFERRING PROVIDER: Mayra Neer, MD Mayra Neer, MD [place] [address] [place],  [place] [address]  RESPONSIBLE PARTY:Son - [name] Prescott [phone] Daughter- Gale Norma- Caregiver [phone] Mon - Fri daytime. Letta Median - caregiver from 2.30p - 7.30pm [phone] Extended Emergency Contact Information Primary Emergency Contact: [place] of [place] Phone: [phone] Relation: Son     [facility] Note Telephone: [phone]  Fax: [phone]  PATIENT NAME: Cheryl Wilcox DOB: [birth year] MRN: [id number]  PRIMARY CARE PROVIDER:   Mayra Neer, MD Mayra Neer, MD [address] [place],  [place] [address]  REFERRING PROVIDER: Mayra Neer, MD Mayra Neer, MD [place] [address] [place],  [place] [address]  RESPONSIBLE PARTY:Son - [name] Klug [phone] Daughter- Gale Norma- Caregiver [phone] Mon - Fri daytime. Letta Median - caregiver from 2.30p - 7.30pm [phone] Extended Emergency Contact Information Primary Emergency Contact: [place] of [place] Phone: [phone] Relation: Son  TELEHEALTH VISIT STATEMENT Due to the COVID-19 crisis, this visit was done via telemedicine and it was initiated and consent by this patient and or family. Video-audio (telehealth) contact was unable to be done due to technical barriers from the patient's side.  Visit is to build trust and highlight Palliative Medicine as specialized medical care for people living with serious illness, aimed at facilitating better quality of life through symptoms  relief, assisting with advance care plan and establishing goals of care.   CHIEF COMPLAINT: Palliative follow up visit/Dementia  RECOMMENDATIONS/PLAN:   1. Advance Care Planning/Code Status: Patient is a DO NOT RESUSCITATE  2. Goals of Care: Goals of care include to maximize quality of life and symptom management.  Visit consisted of counseling and education dealing with the complex and emotionally intense issues of symptom management and palliative care in the setting of serious and potentially life-threatening illness. Palliative care team will continue to support patient, patient's family, and medical team.  3. Symptom management/Plan:  Cognitive and functional decline in line with Dementia disease trajectory. FAST 6D. Encourage reminiscence, word search/puzzles, cueing for recollection.  Promote calm approach and engaging environment.  Continue ongoing supportive care. Gait disturbance: Fall precautions emphasized; use of rolling walker to provide support and help prevent fall. Continue ongoing supportive care.  Palliative will continue to monitor for symptom management/decline and make recommendations as needed. Return 2 months or prn. Encouraged to call provider sooner with any concerns.   HISTORY OF PRESENT ILLNESS:  Cheryl Wilcox is a 87 y.o. female with multiple medical problems including Memory loss related to worsening Alzheimer dementia in line with disease trajectory.  Memory loss is chronic, worsening in line with dementia disease trajectory; this affects patient's quality of life and independence. Caregiver reports memory loss is worse as the day progresses; cues for recollection sometimes helpful. Patient's overall function is declining, ambulating less, not wanting to be bothered, PPS down from 50% 2 months ago to now 40%, now incontinent of bowel, FAST 6D from [facility] last visit.  History of  Hypertensive heart disease with CKD 3,  colon CA, osteoarthritis. History obtained from  review of EMR,  discussion with primary team, and  interview with family, caregiver  and/or patient. Records reviewed and summarized above. All 10 point systems reviewed and are negative except as documented in history of present illness above  Review and summarization of Epic records shows history from other than patient.   Palliative Care was asked to follow this patient by consultation request of Mayra Neer, MD to help address complex decision making in the context of advance care planning and goals of care clarification.  CODE STATUS: DNR  PPS: 40%  HOSPICE ELIGIBILITY/DIAGNOSIS: TBD  PAST MEDICAL HISTORY:  Past Medical History:  Diagnosis Date  . Arthritis    OSTEO  . Cancer ([facility])    COLON 2002  . Chronic kidney disease    CKD STAGE III  . Hyperlipidemia   . Hypertension      SOCIAL HX: @SOCX  Patient lives at home with her daughter for ongoing care   FAMILY HX:  Daughter - Autism  Review lab tests/diagnostics No results for input(s): WBC, HGB, HCT, PLT, MCV in the last 168 hours. No results for input(s): NA, K, CL, CO2, BUN, CREATININE, GLUCOSE in the last 168 hours. Latest GFR by Cockcroft Gault (not valid in AKI or ESRD) CrCl cannot be calculated (No successful lab value found.).  ALLERGIES:  Allergies  Allergen Reactions  . Atorvastatin Other (See Comments)  . Septra [Sulfamethoxazole-Trimethoprim] Other (See Comments)      PERTINENT MEDICATIONS:  Outpatient Encounter Medications as of 08/23/2020  Medication Sig  . acetaminophen (TYLENOL) 500 MG tablet Take 1,000 mg by mouth every 6 (six) hours as needed.  Marland Kitchen amLODipine (NORVASC) 5 MG tablet Take 5 mg by mouth daily.  Marland Kitchen aspirin EC 81 MG tablet Take 81 mg by mouth daily.  Marland Kitchen atorvastatin (LIPITOR) 10 MG tablet Take 10 mg by mouth daily.  . Calcium Carbonate (CALCIUM 600 PO) Take 1 tablet by mouth 2 (two) times daily with a meal.  . Cholecalciferol (VITAMIN D3 PO) Take 2,000 Units by mouth 2 (two) times  daily.  Marland Kitchen ibandronate (BONIVA) 150 MG tablet Take 150 mg by mouth every 30 (thirty) days. Take in the morning with a full glass of water, on an empty stomach, and do not take anything else by mouth or lie down for the next 30 min.  . meloxicam (MOBIC) 15 MG tablet   . nitrofurantoin, macrocrystal-monohydrate, (MACROBID) 100 MG capsule TAKE 1 CAPSULE BY MOUTH WITH FOOD TWICE A DAY FOR 7 DAY(S)  . sulfamethoxazole-trimethoprim (BACTRIM DS,SEPTRA DS) 800-160 MG tablet TAKE 1 TABLET BY MOUTH TWICE A DAY FOR 7 DAYS  . traMADol (ULTRAM) 50 MG tablet TAKE 1 TABLET EVERY 12 HOURS AS NEEDED FOR PAIN  . triamterene-hydrochlorothiazide (DYAZIDE) 37.5-25 MG capsule Take 1 capsule by mouth daily.  Marland Kitchen trimethoprim (TRIMPEX) 100 MG tablet Take 100 mg by mouth daily.   No facility-administered encounter medications on file as of 08/23/2020.   Physical exam deferred due to telehealth medicine.   I spent 60 minutes providing this consultation; time includes time spent with patient/family, chart review and documentation. More than 50% of the time in this consultation was spent on coordinating communication   Thank you for the opportunity to participate in the care of Cheryl Wilcox Please call our office at [phone] if we can be of additional assistance.  Note: Portions of this note were generated with Lobbyist. Dictation errors may occur despite best attempts at proofreading.  Teodoro Spray, NP

## 2020-09-22 ENCOUNTER — Other Ambulatory Visit: Payer: Medicare Other | Admitting: Hospice

## 2020-09-22 ENCOUNTER — Other Ambulatory Visit: Payer: Self-pay

## 2020-09-22 DIAGNOSIS — R413 Other amnesia: Secondary | ICD-10-CM

## 2020-09-22 DIAGNOSIS — Z515 Encounter for palliative care: Secondary | ICD-10-CM | POA: Diagnosis not present

## 2020-09-22 DIAGNOSIS — F0390 Unspecified dementia without behavioral disturbance: Secondary | ICD-10-CM

## 2020-09-22 NOTE — Progress Notes (Signed)
[facility] Consult Note Telephone: [phone]  Fax: [phone]  PATIENT NAME: Cheryl Wilcox DOB: 01/27/1928 MRN: [id number]  PRIMARY CARE PROVIDER:   Mayra Neer, MD Mayra Neer, MD [place] [place],  [place] [address]  REFERRING PROVIDER: Mayra Neer, MD Mayra Neer, MD [place] [address] [place],  [place] [address]  RESPONSIBLE PARTY:Son - [name] Bowron [phone] Daughter- Cheryl Wilcox- Caregiver [phone] Mon - Fri daytime. Letta Median - caregiver from 2.30p - 7.30pm [phone] Extended Emergency Contact Information Primary Emergency Contact: [place] of [place] Phone: [phone] Relation: Son  Contact Information    Name Relation Home Work Mobile   [place] Son [phone]          Visit is to build trust and highlight Palliative Medicine as specialized medical care for people living with serious illness, aimed at facilitating better quality of life through symptoms relief, assisting with advance care planning and complex medical decision making.  Normal Gail at home with patient during visit.  [name] joined visit via Engineer, water.  RECOMMENDATIONS/PLAN:   Advance Care Planning/Code Status: Patient is a DO NOT RESUSCITATE  Goals of Care: Goals of care include to maximize quality of life and symptom management.  MOST selections include limited additional interventions, antibiotics if indicated, IV fluids if indicated, no feeding tube.  Visit consisted of counseling and education dealing with the complex and emotionally intense issues of symptom management and palliative care in the setting of serious and potentially life-threatening illness. Palliative care team will continue to support patient, patient's family, and medical team.  Symptom management/Plan:  Dementia: incremental progressive Memory loss and confusion in line with dementia disease  trajectory. FAST 6d.  Continue ongoing supportive care.  Encourage reminiscence, provide ample time for thought processing and verbalization, cueing for recollection. Hypertension: Continue HTC Z and amlodipine.  Effective. Weakness: Optimize movement and activity as tolerated.  Pace activities with rest in between.  Use of rolling walker to help prevent fall, fall precautions emphasized. Follow up: Palliative care will continue to follow for complex medical decision making, advance care planning, and clarification of goals. Return 6 weeks or prn. Encouraged to call provider sooner with any concerns.  CHIEF COMPLAINT: Palliative follow up visit/Dementia  HISTORY OF PRESENT ILLNESS:  Follow-up on Cheryl Wilcox,  a 87 y.o. female with multiple medical problems including Memory loss related to Alzheimer dementia.Memory loss is chronic, progressive; this affects patient's quality of life and independence.Caregiver reports memory loss is worse as the day progresses; cues for recollection sometimes helpful. Patient's overall function is declining, ambulating less, not wanting to be bothered. History of  Hypertensive heart disease with CKD 3,  colon CA, osteoarthritis. History obtained from review of EMR, discussion with primary team, and interview with family, caregiver and/or patient. Records reviewed and summarized above. All 10 point systems reviewed and are negative except as documented in history of present illness above  Review and summarization of Epic records shows history from other than patient.   Palliative Care was asked to follow this patient by consultation request of Mayra Neer, MD to help address complex decision making in the context of advance care planning and goals of care clarification.   CODE STATUS: DNR  PPS: 40%  HOSPICE ELIGIBILITY/DIAGNOSIS: TBD  PAST MEDICAL HISTORY:  Past Medical History:  Diagnosis Date  . Arthritis    OSTEO  . Cancer ([facility])  COLON 2002  .  Chronic kidney disease    CKD STAGE III  . Hyperlipidemia   . Hypertension      SOCIAL HX: @SOCX  Patient lives at home for ongoing care  FAMILY HX:  Mother: Old age at >68 years Sister: Colon CA  Review lab tests/diagnostics No results for input(s): WBC, HGB, HCT, PLT, MCV in the last 168 hours. No results for input(s): NA, K, CL, CO2, BUN, CREATININE, GLUCOSE in the last 168 hours. CrCl cannot be calculated (No successful lab value found.). No results for input(s): AST, ALT, ALKPHOS, GGT in the last 168 hours.  Invalid input(s): TBILI, CONJBILI, ALB, TOTALPROTEIN No components found for: ALB No results for input(s): APTT, INR in the last 168 hours.  Invalid input(s): PTPATIENT No results for input(s): BNP, PROBNP in the last 168 hours.   ALLERGIES:  Allergies  Allergen Reactions  . Atorvastatin Other (See Comments)  . Septra [Sulfamethoxazole-Trimethoprim] Other (See Comments)      PERTINENT MEDICATIONS:  Outpatient Encounter Medications as of 09/22/2020  Medication Sig  . acetaminophen (TYLENOL) 500 MG tablet Take 1,000 mg by mouth every 6 (six) hours as needed.  Marland Kitchen amLODipine (NORVASC) 5 MG tablet Take 5 mg by mouth daily.  Marland Kitchen aspirin EC 81 MG tablet Take 81 mg by mouth daily.  Marland Kitchen atorvastatin (LIPITOR) 10 MG tablet Take 10 mg by mouth daily.  . Calcium Carbonate (CALCIUM 600 PO) Take 1 tablet by mouth 2 (two) times daily with a meal.  . Cholecalciferol (VITAMIN D3 PO) Take 2,000 Units by mouth 2 (two) times daily.  Marland Kitchen ibandronate (BONIVA) 150 MG tablet Take 150 mg by mouth every 30 (thirty) days. Take in the morning with a full glass of water, on an empty stomach, and do not take anything else by mouth or lie down for the next 30 min.  . meloxicam (MOBIC) 15 MG tablet   . nitrofurantoin, macrocrystal-monohydrate, (MACROBID) 100 MG capsule TAKE 1 CAPSULE BY MOUTH WITH FOOD TWICE A DAY FOR 7 DAY(S)  . sulfamethoxazole-trimethoprim (BACTRIM DS,SEPTRA DS) 800-160 MG tablet  TAKE 1 TABLET BY MOUTH TWICE A DAY FOR 7 DAYS  . traMADol (ULTRAM) 50 MG tablet TAKE 1 TABLET EVERY 12 HOURS AS NEEDED FOR PAIN  . triamterene-hydrochlorothiazide (DYAZIDE) 37.5-25 MG capsule Take 1 capsule by mouth daily.  Marland Kitchen trimethoprim (TRIMPEX) 100 MG tablet Take 100 mg by mouth daily.   No facility-administered encounter medications on file as of 09/22/2020.     ROS  General: NAD, appropriately dressed Constitution: Denies fever/chills EYES: denies vision changes ENMT: denies Xerostomia,  dysphagia Cardiovascular: denies chest pain Pulmonary: denies  cough, denies dyspnea  Abdomen: endorses fair appetite, denies constipation or diarrhea GU: denies dysuria MSK:  endorses ROM limitations, no falls reported Skin: denies rashes/bruising Neurological: endorses weakness, denies pain, denies insomnia Psych: Endorses positive mood Heme/lymph/immuno: denies bruises, no abnormal bleeding   PHYSICAL EXAM  BP 116/66 P 90 02 93% RA R 18 General: In no acute distress, appropriately dressed Cardiovascular: regular rate and rhythm; no edema in BLE Pulmonary: no cough, no increased work of breathing, normal respiratory effort Abdomen: soft, non tender, no guarding, positive bowel sounds in all quadrants GU:  no suprapubic tenderness Eyes: Normal lids, no discharge, sclera anicteric ENMT: Moist mucous membranes Musculoskeletal:  weakness, sarcopenia Skin: no rash to visible skin, warm without cyanosis,  Psych: non-anxious affect Neurological: Weakness but otherwise non focal, memory loss Heme/lymph/immuno: no bruises, no bleeding  I spent 60  minutes providing  this consultation; this includes time spent with patient/family, chart review and documentation. More than 50% of the time in this consultation was spent on counseling and coordinating communication   Thank you for the opportunity to participate in the care of AMBRY DIX Please call our office at [phone] if we can be of  additional assistance.  Note: Portions of this note were generated with Lobbyist. Dictation errors may occur despite best attempts at proofreading.  Teodoro Spray, NP

## 2020-11-02 ENCOUNTER — Other Ambulatory Visit: Payer: Self-pay

## 2020-11-02 ENCOUNTER — Other Ambulatory Visit: Payer: Medicare Other | Admitting: Hospice

## 2020-11-02 DIAGNOSIS — I10 Essential (primary) hypertension: Secondary | ICD-10-CM

## 2020-11-02 DIAGNOSIS — Z515 Encounter for palliative care: Secondary | ICD-10-CM | POA: Diagnosis not present

## 2020-11-02 DIAGNOSIS — F0390 Unspecified dementia without behavioral disturbance: Secondary | ICD-10-CM

## 2020-11-02 NOTE — Progress Notes (Signed)
[facility] Consult Note Telephone: [phone]  Fax: [phone]  PATIENT NAME: Cheryl Wilcox DOB: [birth year] MRN: [id number]  PRIMARY CARE PROVIDER:   Mayra Neer, MD Mayra Neer, MD [place] [place],  [place] [address]  REFERRING PROVIDER: Mayra Neer, MD Mayra Neer, MD [place] [address] [place],  [place] [address]  RESPONSIBLE PARTY:Son - [name] Vea [phone] Daughter- Gale Norma- Caregiver [phone] Mon - Fri daytime. Letta Median - caregiver from 2.30p - 7.30pm [phone] Extended Emergency Contact Information Primary Emergency Contact: [place] of [place] Phone: [phone] Relation: Son Contact Information    Name Relation Home Work Mobile   [place] Son [phone]        Visit is to build trust and highlight Palliative Medicine as specialized medical care for people living with serious illness, aimed at facilitating better quality of life through symptoms relief, assisting with advance care planning and complex medical decision making. [name] requested visit related to patient not wanting to get out of bed yesterday.  RECOMMENDATIONS/PLAN:   Advance Care Planning/Code Status: Patient is a DO NOT RESUSCITATE  Goals of Care: Goals of care include to maximize quality of life and symptom management.  MOST selections include limited additional interventions, antibiotics if indicated, IV fluids if indicated, no feeding tube.  Visit consisted of counseling and education dealing with the complex and emotionally intense issues of symptom management and palliative care in the setting of serious and potentially life-threatening illness. Palliative care team will continue to support patient, patient's family, and medical team.  Symptom management/Plan:  Dementia:ongoing progressive Memory loss and confusion in line with dementia disease trajectory. FAST 6d.   Continue ongoing supportive care.  Encourage reminiscence, provide ample time for thought processing and verbalization, cueing for recollection. Hypertension: Continue HTC Z and amlodipine.  Effective. Weakness: Balance of rest and performance activity. Optimize movement and activity as tolerated.  Patient is a high fall risk. Fall precautions discussed with patient and caregiver. Constipation: Managed well with Senna S and Miralax Follow up: Palliative care will continue to follow for complex medical decision making, advance care planning, and clarification of goals. Return 6 weeks or prn. Encouraged to call provider sooner with any concerns.  CHIEF COMPLAINT: Palliative follow up visit/Dementia  HISTORY OF PRESENT ILLNESS: Follow-up onEthel R Wilcox,  a 87 y.o.femalewith multiple medical problems including Dementia which is progressive in line with Dementia disease trajectory. She sometimes refuses to get out of bed and does not want to be bothered. Today, she is seen spry, sitting up and answering questions directed at her during visit. She denies pain/discomfort; no complaints. Caregiver with no complaint today. History of HTN CKD 3, colon CA, osteoarthritis constipation.History obtained from review of EMR, discussion with primary team, andinterview with family, caregiverand/or patient. Records reviewed and summarized above. All 10 point systems reviewed and are negative except as documented in history of present illness above  Review and summarization of Epic records shows history from other than patient.   Palliative Care was asked to follow this patient o help address complex decision making in the context of advance care planning and goals of care clarification.   PPS: 40%  ROS  General: NAD, appropriately dressed Constitution: Denies fever/chills EYES: denies vision changes ENMT: denies [place] Cardiovascular: denies chest pain Pulmonary: deniescough,  denies dyspnea  Abdomen: endorses fair appetite, denies constipation or diarrhea GU: denies dysuria [id number] ROM limitations, no falls  reported Skin: denies rashes/bruising Neurological: endorses weakness, denies pain, denies insomnia Psych: Endorses positive mood Heme/lymph/immuno: denies bruises, no abnormal bleeding   PHYSICAL EXAM  BP 122/72 P 96 02 93% RA R 18 General: In no acute distress, appropriately dressed Cardiovascular: regular rate and rhythm; no edema in BLE Pulmonary: no cough, no increased work of breathing, normal respiratory effort Abdomen: soft, non tender, no guarding, positive bowel sounds in all quadrants GU:  no suprapubic tenderness Eyes: Normal lids, no discharge, sclera anicteric ENMT: Moist mucous membranes Musculoskeletal:  weakness, sarcopenia Skin: no rash to visible skin, warm without cyanosis,  Psych: non-anxious affect Neurological: Weakness but otherwise non focal, memory loss Heme/lymph/immuno: no bruises, no bleeding   PERTINENT MEDICATIONS:  Outpatient Encounter Medications as of 11/02/2020  Medication Sig  . acetaminophen (TYLENOL) 500 MG tablet Take 1,000 mg by mouth every 6 (six) hours as needed.  Marland Kitchen amLODipine (NORVASC) 5 MG tablet Take 5 mg by mouth daily.  Marland Kitchen aspirin EC 81 MG tablet Take 81 mg by mouth daily.  Marland Kitchen atorvastatin (LIPITOR) 10 MG tablet Take 10 mg by mouth daily.  . Calcium Carbonate (CALCIUM 600 PO) Take 1 tablet by mouth 2 (two) times daily with a meal.  . Cholecalciferol (VITAMIN D3 PO) Take 2,000 Units by mouth 2 (two) times daily.  Marland Kitchen ibandronate (BONIVA) 150 MG tablet Take 150 mg by mouth every 30 (thirty) days. Take in the morning with a full glass of water, on an empty stomach, and do not take anything else by mouth or lie down for the next 30 min.  . meloxicam (MOBIC) 15 MG tablet   . nitrofurantoin, macrocrystal-monohydrate, (MACROBID) 100 MG capsule TAKE 1 CAPSULE BY MOUTH WITH FOOD TWICE A DAY FOR 7 DAY(S)  .  sulfamethoxazole-trimethoprim (BACTRIM DS,SEPTRA DS) 800-160 MG tablet TAKE 1 TABLET BY MOUTH TWICE A DAY FOR 7 DAYS  . traMADol (ULTRAM) 50 MG tablet TAKE 1 TABLET EVERY 12 HOURS AS NEEDED FOR PAIN  . triamterene-hydrochlorothiazide (DYAZIDE) 37.5-25 MG capsule Take 1 capsule by mouth daily.  Marland Kitchen trimethoprim (TRIMPEX) 100 MG tablet Take 100 mg by mouth daily.   No facility-administered encounter medications on file as of 11/02/2020.    HOSPICE ELIGIBILITY/DIAGNOSIS: TBD  PAST MEDICAL HISTORY:  Past Medical History:  Diagnosis Date  . Arthritis    OSTEO  . Cancer ([facility])    COLON 2002  . Chronic kidney disease    CKD STAGE III  . Hyperlipidemia   . Hypertension      Review lab tests/diagnostics No results for input(s): WBC, HGB, HCT, PLT, MCV in the last 168 hours. No results for input(s): NA, K, CL, CO2, BUN, CREATININE, GLUCOSE in the last 168 hours. CrCl cannot be calculated (No successful lab value found.).  ALLERGIES:  Allergies  Allergen Reactions  . Atorvastatin Other (See Comments)  . Septra [Sulfamethoxazole-Trimethoprim] Other (See Comments)      I spent 45  minutes providing this consultation; this includes time spent with patient/family, chart review and documentation. More than 50% of the time in this consultation was spent on counseling and coordinating communication   Thank you for the opportunity to participate in the care of TALLI KIMMER Please call our office at [phone] if we can be of additional assistance.  Note: Portions of this note were generated with Lobbyist. Dictation errors may occur despite best attempts at proofreading.  Teodoro Spray, NP

## 2020-11-24 ENCOUNTER — Other Ambulatory Visit: Payer: Medicare Other | Admitting: Hospice

## 2020-11-24 ENCOUNTER — Other Ambulatory Visit: Payer: Self-pay

## 2020-11-24 DIAGNOSIS — E46 Unspecified protein-calorie malnutrition: Secondary | ICD-10-CM | POA: Diagnosis not present

## 2020-11-24 DIAGNOSIS — E782 Mixed hyperlipidemia: Secondary | ICD-10-CM | POA: Diagnosis not present

## 2020-11-24 DIAGNOSIS — I129 Hypertensive chronic kidney disease with stage 1 through stage 4 chronic kidney disease, or unspecified chronic kidney disease: Secondary | ICD-10-CM | POA: Diagnosis not present

## 2020-11-24 DIAGNOSIS — R110 Nausea: Secondary | ICD-10-CM

## 2020-11-24 DIAGNOSIS — R7301 Impaired fasting glucose: Secondary | ICD-10-CM | POA: Diagnosis not present

## 2020-11-24 DIAGNOSIS — Z515 Encounter for palliative care: Secondary | ICD-10-CM | POA: Diagnosis not present

## 2020-11-24 DIAGNOSIS — N183 Chronic kidney disease, stage 3 unspecified: Secondary | ICD-10-CM | POA: Diagnosis not present

## 2020-11-24 DIAGNOSIS — R1310 Dysphagia, unspecified: Secondary | ICD-10-CM

## 2020-11-24 NOTE — Progress Notes (Signed)
[facility] [facility] Consult Note Telephone: [phone]  Fax: [phone]  PATIENT NAME: Cheryl Wilcox DOB: 08-03-1927 MRN: [id number]  PRIMARY CARE PROVIDER:   Mayra Neer, MD Mayra Neer, MD [place]. [employer] [place],  [place] [address]  REFERRING PROVIDER: Mayra Neer, MD Mayra Neer, MD [place] [employer] [place],  [place] [address]  RESPONSIBLE PARTYJerene Canny Neal Veteran daytime. Letta Median - caregiver from 2.30p - 7.30pm [phone]  Extended Emergency Contact Information Primary Emergency Contact: [place] Phone: [phone] Relation: Son  Contact Information     Name Relation Home Work Mobile   [place] Son [phone]         Visit is to build trust and highlight Palliative Medicine as specialized medical care for people living with serious illness, aimed at facilitating better quality of life through symptoms relief, assisting with advance care planning and complex medical decision making. [name] and [place] are home with patient during visit. This is a follow up visit.  RECOMMENDATIONS/PLAN:   Advance Care Planning/Code Status: CODE STATUS reviewed today with patient and son [name].  Patient is a DO NOT RESUSCITATE.  Goals of Care: Goals of care include to maximize quality of life and symptom management.  MOST selections reviewed-Limited additional interventions, antibiotics if indicated, IV fluids if indicated, no feeding tube.  Visit consisted of counseling and education dealing with the complex and emotionally intense issues of symptom management and palliative care in the setting of serious and potentially life-threatening illness. Palliative care team will continue to support patient, patient's family, and medical team. I spent 16 minutes providing this initial consultation. More than 50% of the  time in this consultation was spent on counseling patient and coordinating communication. -------------------------------------------------------------------------------------------------------------------------------------- Symptom management/Plan:  Nausea: Patient reports having nausea in the mornings, no vomiting.  Caregiver reports no bowel movement in greater than 3 days because patient not eating a lot as before.  Nausea likely related to constipation.  Discussed to give Metamucil and Senna S as ordered; MiraLAX 17 g mixed in 3 to 6 ounces of fluid twice daily.  Return to MiraLAX daily as needed.  Patient is going to see PCP today.   Recommendation: KUB stat.  Zofran 4 mg every 8 hours as needed for nausea.  CBC BMP.  Education reinforced on adhering to bowel regimen. Dysphagia: Downgrade to mechanical soft diet.  Aspiration precautions discussed.  Recommendation: ST consult.  Education provided on dysphagia as an associated symptom of worsening dementia. Dementia:ongoing progressive Memory loss and confusion in line with dementia disease trajectory. FAST 6d.  Continue ongoing supportive care. Follow up: Palliative care will continue to follow for complex medical decision making, advance care planning, and clarification of goals. Return 6 weeks or prn. Encouraged to call provider sooner with any concerns.  CHIEF COMPLAINT: Palliative follow up  HISTORY OF PRESENT ILLNESS:  Cheryl Wilcox a 87 y.o. female with multiple medical problems including nausea without vomiting, new onset, intermittent.  Nausea is worse in the morning when patient wakes up and abates during the day.  No bowel movement in greater than 3 days.  Nausea impairs her appetite and quality of life.  Patient also reports difficulty swallowing; able to drink Ensure.  History of hypertension, CKD 3 colon cancer, osteoarthritis, constipation.  History obtained from review of EMR, discussion with primary team,  family and/or patient.  Records reviewed and summarized above. All 10 point systems reviewed and are negative except as documented in history of present illness above  Review and summarization of Epic records shows history from other than patient.   Palliative Care was asked to follow this patient o help address complex decision making in the context of advance care planning and goals of care clarification.   PHYSICAL EXAM  General: In no acute distress, appropriately dressed Cardiovascular: regular rate and rhythm; no edema in BLE Pulmonary: no cough, no increased work of breathing, normal respiratory effort Abdomen: non tender, no guarding, positive bowel sounds in all quadrants GU:  no suprapubic tenderness Eyes: Normal lids, no discharge ENMT: Moist mucous membranes Musculoskeletal:  weakness, sarcopenia Skin: no rash to visible skin, warm without cyanosis,  Psych: non-anxious affect Neurological: Weakness but otherwise non focal Heme/lymph/immuno: no bruises, no bleeding  PERTINENT MEDICATIONS:  Outpatient Encounter Medications as of 11/24/2020  Medication Sig   acetaminophen (TYLENOL) 500 MG tablet Take 1,000 mg by mouth every 6 (six) hours as needed.   amLODipine (NORVASC) 5 MG tablet Take 5 mg by mouth daily.   aspirin EC 81 MG tablet Take 81 mg by mouth daily.   atorvastatin (LIPITOR) 10 MG tablet Take 10 mg by mouth daily.   Calcium Carbonate (CALCIUM 600 PO) Take 1 tablet by mouth 2 (two) times daily with a meal.   Cholecalciferol (VITAMIN D3 PO) Take 2,000 Units by mouth 2 (two) times daily.   ibandronate (BONIVA) 150 MG tablet Take 150 mg by mouth every 30 (thirty) days. Take in the morning with a full glass of water, on an empty stomach, and do not take anything else by mouth or lie down for the next 30 min.   meloxicam (MOBIC) 15 MG tablet    nitrofurantoin, macrocrystal-monohydrate, (MACROBID) 100 MG capsule TAKE 1 CAPSULE BY MOUTH WITH FOOD TWICE A DAY FOR 7 DAY(S)    sulfamethoxazole-trimethoprim (BACTRIM DS,SEPTRA DS) 800-160 MG tablet TAKE 1 TABLET BY MOUTH TWICE A DAY FOR 7 DAYS   traMADol (ULTRAM) 50 MG tablet TAKE 1 TABLET EVERY 12 HOURS AS NEEDED FOR PAIN   triamterene-hydrochlorothiazide (DYAZIDE) 37.5-25 MG capsule Take 1 capsule by mouth daily.   trimethoprim (TRIMPEX) 100 MG tablet Take 100 mg by mouth daily.   No facility-administered encounter medications on file as of 11/24/2020.    HOSPICE ELIGIBILITY/DIAGNOSIS: TBD  PAST MEDICAL HISTORY:  Past Medical History:  Diagnosis Date   Arthritis    OSTEO   Cancer ([facility])    COLON 2002   Chronic kidney disease    CKD STAGE III   Hyperlipidemia    Hypertension      ALLERGIES:  Allergies  Allergen Reactions   Atorvastatin Other (See Comments)   Septra [Sulfamethoxazole-Trimethoprim] Other (See Comments)     Thank you for the opportunity to participate in the care of Cheryl Wilcox Please call our office at [phone] if we can be of additional assistance.  Note: Portions of this note were generated with Lobbyist. Dictation errors may occur despite best attempts at proofreading.  Teodoro Spray, NP

## 2020-11-26 ENCOUNTER — Ambulatory Visit: Payer: BLUE CROSS/BLUE SHIELD | Admitting: Podiatry

## 2020-11-30 ENCOUNTER — Other Ambulatory Visit: Payer: Self-pay

## 2020-11-30 ENCOUNTER — Other Ambulatory Visit: Payer: Medicare Other | Admitting: Hospice

## 2020-11-30 DIAGNOSIS — Z515 Encounter for palliative care: Secondary | ICD-10-CM

## 2020-11-30 DIAGNOSIS — F0390 Unspecified dementia without behavioral disturbance: Secondary | ICD-10-CM

## 2020-11-30 DIAGNOSIS — K5901 Slow transit constipation: Secondary | ICD-10-CM | POA: Diagnosis not present

## 2020-11-30 NOTE — Progress Notes (Signed)
[facility] Consult Note Telephone: [phone]  Fax: [phone]  PATIENT NAME: Cheryl Wilcox DOB: 09/03/1927 MRN: [id number]  PRIMARY CARE PROVIDER:   Mayra Neer, MD Mayra Neer, MD [place]. [employer] [place],  [place] [address]  REFERRING PROVIDER: Mayra Neer, MD Mayra Neer, MD [place] [employer] [place],  [place] [address] [name] RESPONSIBLE PARTY:   Contact Information     Name Relation Home Work Mobile   [place] Son [phone]         Been called and requested visit due to patient's rapid decline.  Visit is to build trust and highlight Palliative Medicine as specialized medical care for people living with serious illness, aimed at facilitating better quality of life through symptoms relief, assisting with advance care planning and complex medical decision making. This is a follow up visit.  RECOMMENDATIONS/PLAN:   Advance Care Planning/Code Status: Patient is a DO NOT RESUSCITATE  Goals of Care: Discussion with [name] on goals of care as patient continues to significantly decline in functional status.  He is open to hospice service.;  Decision to de-escalate disease focused treatment; comfort measures only.  NP initiated hospice referral following collaboration with Dr. Hollace Kinnier and Dr. Chriss Czar Visit consisted of counseling and education dealing with the complex and emotionally intense issues of symptom management and palliative care in the setting of serious and potentially life-threatening illness. Palliative care team will continue to support patient, patient's family, and medical team. NP spent 46 minutes providing this palliative consultation.  More than 50% of this time was spent on counseling and  coordinating communication. -------------------------------------------------------------------------------------------------------------------------- Symptom management/Plan:  Advanced  dementia: Worsening; significant decline in functional status-patient not out of bed greater than 3 days, nonambulatory, not eating food, sips only.  PPS currently 20%, FLACC 0, FAST C.   Weakness: Balance of rest and performance activity.  Patient is sleeping up to 18 hours a day up from 12 hours a day 3 months ago.  Optimize movement and activity as tolerated. Fall precautions discussed with patient and caregiver. Constipation: Managed well with Senna S and Miralax  Follow up: Palliative care will continue to follow for complex medical decision making, advance care planning, and clarification of goals. Return 6 weeks or prn. Encouraged to call provider sooner with any concerns.  CHIEF COMPLAINT: Palliative follow up  HISTORY OF PRESENT ILLNESS:  Cheryl Wilcox a 87 y.o. female with multiple medical problems including advanced dementia, worsening in the last 1 week; patient no longer able to get out of bed or sit up.  No food in the past 3 days, only sips and fluid.  Worsening dementia impairs her quality of life; Patient's lethargic and sleeping more than 18 hours a day, ongoing supportive interventions not helpful.  Hospice referral initiated today.  History of CKD 3, Colon CA, osteoarthritis, constipation.  History obtained from review of EMR, discussion with primary team, family and/or patient. Records reviewed and summarized above. All 10 point systems reviewed and are negative except as documented in history of present illness above  Review and summarization of Epic records shows history from other than patient.   Palliative Care was asked to follow this patient o help address complex decision making in the context of advance care planning and goals of care clarification.   PHYSICAL EXAM  General: Lethargic, in no acute distress Cardiovascular: regular rate and rhythm; no edema in BLE Pulmonary: no cough, no increased work of breathing,  R 22 Abdomen: soft, non tender, no guarding, positive  bowel sounds in all quadrants GU:  no suprapubic tenderness Eyes: Normal lids, no discharge ENMT: Moist mucous membranes Musculoskeletal:  weakness, sarcopenia Skin: no rash to visible skin, warm without cyanosis,  Psych: non-anxious affect Neurological: Weakness but otherwise non focal Heme/lymph/immuno: no bruises, no bleeding  PERTINENT MEDICATIONS:  Outpatient Encounter Medications as of 11/30/2020  Medication Sig   acetaminophen (TYLENOL) 500 MG tablet Take 1,000 mg by mouth every 6 (six) hours as needed.   amLODipine (NORVASC) 5 MG tablet Take 5 mg by mouth daily.   aspirin EC 81 MG tablet Take 81 mg by mouth daily.   atorvastatin (LIPITOR) 10 MG tablet Take 10 mg by mouth daily.   Calcium Carbonate (CALCIUM 600 PO) Take 1 tablet by mouth 2 (two) times daily with a meal.   Cholecalciferol (VITAMIN D3 PO) Take 2,000 Units by mouth 2 (two) times daily.   ibandronate (BONIVA) 150 MG tablet Take 150 mg by mouth every 30 (thirty) days. Take in the morning with a full glass of water, on an empty stomach, and do not take anything else by mouth or lie down for the next 30 min.   meloxicam (MOBIC) 15 MG tablet    nitrofurantoin, macrocrystal-monohydrate, (MACROBID) 100 MG capsule TAKE 1 CAPSULE BY MOUTH WITH FOOD TWICE A DAY FOR 7 DAY(S)   sulfamethoxazole-trimethoprim (BACTRIM DS,SEPTRA DS) 800-160 MG tablet TAKE 1 TABLET BY MOUTH TWICE A DAY FOR 7 DAYS   traMADol (ULTRAM) 50 MG tablet TAKE 1 TABLET EVERY 12 HOURS AS NEEDED FOR PAIN   triamterene-hydrochlorothiazide (DYAZIDE) 37.5-25 MG capsule Take 1 capsule by mouth daily.   trimethoprim (TRIMPEX) 100 MG tablet Take 100 mg by mouth daily.   No facility-administered encounter medications on file as of 11/30/2020.    HOSPICE ELIGIBILITY/DIAGNOSIS: TBD  PAST MEDICAL HISTORY:  Past Medical History:  Diagnosis Date   Arthritis    OSTEO   Cancer [facility])    COLON 2002   Chronic kidney disease    CKD STAGE III   Hyperlipidemia     Hypertension      Review lab tests/diagnostics  ALLERGIES:  Allergies  Allergen Reactions   Atorvastatin Other (See Comments)   Septra [Sulfamethoxazole-Trimethoprim] Other (See Comments)      Thank you for the opportunity to participate in the care of Cheryl Wilcox Please call our office at [phone] if we can be of additional assistance.  Note: Portions of this note were generated with Lobbyist. Dictation errors may occur despite best attempts at proofreading.  Teodoro Spray, NP

## 2020-12-27 DEATH — deceased
